# Patient Record
Sex: Male | Born: 1995 | Race: Black or African American | Hispanic: No | Marital: Single | Smoking: Former smoker
Health system: Southern US, Community
[De-identification: ages and names within clinical notes are randomized; demographics above are authoritative.]

## PROBLEM LIST (undated history)

## (undated) DIAGNOSIS — M549 Dorsalgia, unspecified: Secondary | ICD-10-CM

---
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
## 2018-01-21 ENCOUNTER — Emergency Department: Payer: Self-pay

## 2018-01-21 ENCOUNTER — Other Ambulatory Visit: Payer: Self-pay

## 2018-01-21 ENCOUNTER — Emergency Department
Admission: EM | Admit: 2018-01-21 | Discharge: 2018-01-21 | Disposition: A | Discharge status: Home / Self Care | Payer: Self-pay | Attending: Emergency Medicine | Admitting: Emergency Medicine

## 2018-01-21 DIAGNOSIS — G8929 Other chronic pain: Secondary | ICD-10-CM | POA: Insufficient documentation

## 2018-01-21 DIAGNOSIS — M545 Low back pain: Secondary | ICD-10-CM | POA: Insufficient documentation

## 2018-01-21 MED ORDER — BACLOFEN 10 MG PO TABS: 10.0000 mg | ORAL_TABLET | Freq: Every day | ORAL | 1 refills | Status: DC

## 2018-01-21 MED ORDER — MELOXICAM 15 MG PO TABS: 15.0000 mg | ORAL_TABLET | Freq: Every day | ORAL | 2 refills | Status: DC

## 2018-01-21 NOTE — ED Provider Notes (Addendum)
[facility] Emergency Department Provider Note  ____________________________________________   First MD Initiated Contact with Patient 01/21/18 1854     (approximate)  I have reviewed the triage vital signs and the nursing notes.   HISTORY  Chief Complaint Back Pain and Chest Pain    HPI Larry Soto is a 22 y.o. male presents emergency department complaining of intermittent low back pain, upper back pain shoulder pain and chest pain.  States all the pain is worse with movement.  States he has had this ongoing pain which is worse at night and in the mornings.  He states that this all with movement.  He denies any shortness of breath.  He denies fever or cough.  History reviewed. No pertinent past medical history.  There are no active problems to display for this patient.   History reviewed. No pertinent surgical history.  Prior to Admission medications   Medication Sig Start Date End Date Taking? Authorizing Provider  baclofen (LIORESAL) 10 MG tablet Take 1 tablet (10 mg total) by mouth daily. 01/21/18 01/21/19  Yuliana Vandrunen, Roselyn Bering, PA-C  meloxicam (MOBIC) 15 MG tablet Take 1 tablet (15 mg total) by mouth daily. 01/21/18 01/21/19  Faythe Ghee, PA-C    Allergies Patient has no known allergies.  No family history on file.  Social History Social History   Tobacco Use  . Smoking status: Never Smoker  . Smokeless tobacco: Never Used  Substance Use Topics  . Alcohol use: No  . Drug use: No    Review of Systems  Constitutional: No fever/chills Eyes: No visual changes. ENT: No sore throat. Respiratory: Denies cough Genitourinary: Negative for dysuria. Musculoskeletal: Positive for back pain, shoulder pain, and chest pain. Skin: Negative for rash.    ____________________________________________   PHYSICAL EXAM:  VITAL SIGNS: ED Triage Vitals  Enc Vitals Group     BP 01/21/18 1752 (!) 154/86     Pulse Rate 01/21/18 1752 100     Resp  01/21/18 1752 20     Temp 01/21/18 1752 98.6 F (37 C)     Temp Source 01/21/18 1752 Oral     SpO2 01/21/18 1752 99 %     Weight 01/21/18 1753 144 lb (65.3 kg)     Height 01/21/18 1753 6' 2.5" (1.892 m)     Head Circumference --      Peak Flow --      Pain Score 01/21/18 1754 8     Pain Loc --      Pain Edu? --      Excl. in GC? --     Constitutional: Alert and oriented. Well appearing and in no acute distress. Eyes: Conjunctivae are normal.  Head: Atraumatic. Nose: No congestion/rhinnorhea. Mouth/Throat: Mucous membranes are moist.   Neck: Is supple, no lymphadenopathy is noted Cardiovascular: Normal rate, regular rhythm.  Heart sounds are normal Respiratory: Normal respiratory effort.  No retractions, lungs are clear to auscultation GU: deferred Musculoskeletal: FROM all extremities, warm and well perfused, lumbar spine is tender to palpation.  Shoulders are tender in the trapezius, the pec muscles are tender in the sternum is mildly tender. Neurologic:  Normal speech and language.  Skin:  Skin is warm, dry and intact. No rash noted. Psychiatric: Mood and affect are normal. Speech and behavior are normal.  ____________________________________________   LABS (all labs ordered are listed, but only abnormal results are displayed)  Labs Reviewed - No data to display ____________________________________________   ____________________________________________  RADIOLOGY  Lumbar spine is negative for any acute abnormality  ____________________________________________   PROCEDURES  Procedure(s) performed: EKG ordered in triage shows normal sinus rhythm, this was read by Dr. Marisa SeverinSiadecki  Procedures    ____________________________________________   INITIAL IMPRESSION / ASSESSMENT AND PLAN / ED COURSE  Pertinent labs & imaging results that were available during my care of the patient were reviewed by me and considered in my medical decision making (see chart for  details).  Patient is 22 year old male presents emergency department complaining of intermittent back pain, chest pain and shoulder pain for about 7 months.  He denies any injury  Physical exam he appears very well.  He is thin.  The lumbar spine is mildly tender to palpation.  The remainder of the tenderness in the shoulders and chest is mainly in the muscles.  X-ray of the lumbar spine was ordered   X-ray is negative for any acute abnormality.  X-ray results were given to the patient.  He was given a prescription for meloxicam and baclofen.  He is to follow-up with her regular doctor for full physical.  Explained to him that we do not do standard blood tests such as vitamin D which he may need.  Family members are questioning why we did not do blood work.  Explained to him that pain for 7 months is considered chronic and that it would be advisable for him to see his family physician for a full physical with lab work.  They state they understand.  The patient was discharged in stable condition  As part of my medical decision making, I reviewed the following data within the electronic MEDICAL RECORD NUMBER Nursing notes reviewed and incorporated, Radiograph reviewed x-ray of the lumbar spine is negative, EKG was read by Dr. Marisa SeverinSiadecki, shows normal sinus rhythm Notes from prior ED visits and [place] Controlled Substance Database  ____________________________________________   FINAL CLINICAL IMPRESSION(S) / ED DIAGNOSES  Final diagnoses:  Chronic bilateral low back pain without sciatica      NEW MEDICATIONS STARTED DURING THIS VISIT:  Discharge Medication List as of 01/21/2018  7:56 PM    START taking these medications   Details  baclofen (LIORESAL) 10 MG tablet Take 1 tablet (10 mg total) by mouth daily., Starting Tue 01/21/2018, Until Wed 01/21/2019, Print    meloxicam (MOBIC) 15 MG tablet Take 1 tablet (15 mg total) by mouth daily., Starting Tue 01/21/2018, Until Wed 01/21/2019, Print          Note:  This document was prepared using Dragon voice recognition software and may include unintentional dictation errors.    Faythe GheeFisher, Kyriaki Moder W, PA-C 01/21/18 2251    Faythe GheeFisher, Solimar Maiden W, PA-C 01/22/18 0000    Dionne BucySiadecki, Sebastian, MD 01/22/18 [phone]

## 2018-01-21 NOTE — Discharge Instructions (Signed)
Follow-up with your regular doctor if not better in 5-7 days.  Drink plenty of fluids.  Take medication as needed.  Performed stretches daily to offset your back pain.  Good posture also helps.

## 2018-01-21 NOTE — ED Triage Notes (Signed)
Presents with intermittent back pain chest pain and shoulder for about 7 month  Denies any fever,or cough or fever

## 2018-04-14 ENCOUNTER — Emergency Department
Admission: EM | Admit: 2018-04-14 | Discharge: 2018-04-14 | Disposition: A | Discharge status: Home / Self Care | Payer: Self-pay | Attending: Dermatology | Admitting: Dermatology

## 2018-04-14 ENCOUNTER — Other Ambulatory Visit: Payer: Self-pay

## 2018-04-14 DIAGNOSIS — M545 Low back pain, unspecified: Secondary | ICD-10-CM

## 2018-04-14 DIAGNOSIS — R51 Headache: Secondary | ICD-10-CM | POA: Insufficient documentation

## 2018-04-14 DIAGNOSIS — R519 Headache, unspecified: Secondary | ICD-10-CM

## 2018-04-14 LAB — URINALYSIS, COMPLETE (UACMP) WITH MICROSCOPIC
Bacteria, UA: NONE SEEN
Bilirubin Urine: NEGATIVE
Glucose, UA: NEGATIVE mg/dL
Hgb urine dipstick: NEGATIVE
Ketones, ur: NEGATIVE mg/dL
Nitrite: NEGATIVE
Protein, ur: NEGATIVE mg/dL
SQUAMOUS EPITHELIAL / LPF: NONE SEEN (ref 0–5)
Specific Gravity, Urine: 1.023 (ref 1.005–1.030)
pH: 5 (ref 5.0–8.0)

## 2018-04-14 MED ORDER — KETOROLAC TROMETHAMINE 30 MG/ML IJ SOLN
30.0000 mg | Freq: Once | INTRAMUSCULAR | Status: AC
  Administered 2018-04-14: 30 mg via INTRAMUSCULAR
  Filled 2018-04-14: qty 1

## 2018-04-14 MED ORDER — NAPROXEN 500 MG PO TABS: 500.0000 mg | ORAL_TABLET | Freq: Two times a day (BID) | ORAL | 0 refills | Status: DC

## 2018-04-14 NOTE — ED Provider Notes (Signed)
[facility] Emergency Department Provider Note  ___________________________________________   First MD Initiated Contact with Patient 04/14/18 1411     (approximate)  I have reviewed the triage vital signs and the nursing notes.   HISTORY  Chief Complaint Back Pain and Headache  HPI Larry Soto is a 22 y.o. male is here with complaint of mid to lower back pain for 1 month.  Patient denies any injury.  He states he is occasionally taken Tylenol that his grandmother had but no other medications.  He denies any urinary symptoms or history of kidney stones.  He also complains of a headache and bilateral ear pain that also has been occurring for 1 month.  He denies any fever, chills, nausea or vomiting.  He denies any difficulty with his vision, dizziness.  He today rates his pain a 10/10.  History reviewed. No pertinent past medical history.  There are no active problems to display for this patient.   History reviewed. No pertinent surgical history.  Prior to Admission medications   Medication Sig Start Date End Date Taking? Authorizing Provider  naproxen (NAPROSYN) 500 MG tablet Take 1 tablet (500 mg total) by mouth 2 (two) times daily with a meal. 04/14/18   Tommi Rumps, PA-C    Allergies Patient has no known allergies.  No family history on file.  Social History Social History   Tobacco Use  . Smoking status: Never Smoker  . Smokeless tobacco: Never Used  Substance Use Topics  . Alcohol use: No  . Drug use: No    Review of Systems Constitutional: No fever/chills Eyes: No visual changes. ENT: No sore throat.  Positive bilateral ear pain. Cardiovascular: Denies chest pain. Respiratory: Denies shortness of breath. Gastrointestinal: No abdominal pain.  No nausea, no vomiting.  Genitourinary: Negative for dysuria.  Negative for hematuria. Musculoskeletal: Positive for back pain. Skin: Negative for rash. Neurological: Positive for  headaches, focal weakness or numbness. ___________________________________________   PHYSICAL EXAM:  VITAL SIGNS: ED Triage Vitals [04/14/18 1341]  Enc Vitals Group     BP 132/83     Pulse Rate 82     Resp 14     Temp 98.3 F (36.8 C)     Temp Source Oral     SpO2 100 %     Weight 145 lb (65.8 kg)     Height 6\' 3"  (1.905 m)     Head Circumference      Peak Flow      Pain Score 10     Pain Loc      Pain Edu?      Excl. in GC?    Constitutional: Alert and oriented. Well appearing and in no acute distress. Eyes: Conjunctivae are normal. PERRL. EOMI. Head: Atraumatic. Nose: No congestion/rhinnorhea.  Season TMs are clear bilaterally. Mouth/Throat: Mucous membranes are moist.  Oropharynx non-erythematous. Neck: No stridor.  Nontender to palpation posteriorly.  Range of motion is without restriction or pain. Hematological/Lymphatic/Immunilogical: No cervical lymphadenopathy. Cardiovascular: Normal rate, regular rhythm. Grossly normal heart sounds.  Good peripheral circulation. Respiratory: Normal respiratory effort.  No retractions. Lungs CTAB. Gastrointestinal: Soft and nontender. No distention.  No CVA tenderness. Musculoskeletal: Examination of the lower back there is no gross deformity and no point tenderness on palpation.  Range of motion is without restriction.  No active muscle spasms were seen.  Normal gait was noted.  Good muscle strength bilaterally. Neurologic:  Normal speech and language. No gross focal neurologic deficits are appreciated.  Cranial nerves II through XII grossly intact.  No gait instability. Skin:  Skin is warm, dry and intact. No rash noted. Psychiatric: Mood and affect are normal. Speech and behavior are normal.  ____________________________________________   LABS (all labs ordered are listed, but only abnormal results are displayed)  Labs Reviewed  URINALYSIS, COMPLETE (UACMP) WITH MICROSCOPIC - Abnormal; Notable for the following components:       Result Value   Color, Urine YELLOW (*)    APPearance CLEAR (*)    Leukocytes, UA TRACE (*)    All other components within normal limits    PROCEDURES  Procedure(s) performed: None  Procedures  Critical Care performed: No  ____________________________________________   INITIAL IMPRESSION / ASSESSMENT AND PLAN / ED COURSE  As part of my medical decision making, I reviewed the following data within the electronic MEDICAL RECORD NUMBER Notes from prior ED visits and [place] Controlled Substance Database  Patient was given Toradol 30 mg IM while in the ED.  He was reassured that urinalysis was negative for any infection.  He is encouraged to take naproxen 500 mg twice daily every day for the next 7 days.  He also is encouraged to [facility] in the past to make an appointment for any continued medication and if not improving.  ____________________________________________   FINAL CLINICAL IMPRESSION(S) / ED DIAGNOSES  Final diagnoses:  Acute bilateral low back pain without sciatica  Nonintractable headache, unspecified chronicity pattern, unspecified headache type     ED Discharge Orders        Ordered    naproxen (NAPROSYN) 500 MG tablet  2 times daily with meals     04/14/18 1555       Note:  This document was prepared using Dragon voice recognition software and may include unintentional dictation errors.    Tommi RumpsSummers,  L, PA-C 04/14/18 1559    Jeanmarie PlantMcShane, James A, MD 04/25/18 [phone]

## 2018-04-14 NOTE — ED Triage Notes (Signed)
Pt c/o lower/mid back pain - c/o bilat eye pain with twitching - bilat ear pain - headaches

## 2018-04-14 NOTE — ED Notes (Signed)
See triage note  Presents with lower back pain which has been intermittent for about 6 months  Denies any injury  Ambulates well

## 2018-04-14 NOTE — Discharge Instructions (Addendum)
Call [facility] and make an appointment.  Begin taking naproxen 500 mg twice daily with food.  You may also use ice or heat to your back as needed.  Urinalysis today was negative for infection.

## 2018-06-04 ENCOUNTER — Emergency Department
Admission: EM | Admit: 2018-06-04 | Discharge: 2018-06-04 | Disposition: A | Discharge status: Home / Self Care | Payer: Self-pay | Attending: Emergency Medicine | Admitting: Emergency Medicine

## 2018-06-04 ENCOUNTER — Other Ambulatory Visit: Payer: Self-pay

## 2018-06-04 ENCOUNTER — Emergency Department: Payer: Self-pay

## 2018-06-04 DIAGNOSIS — M545 Low back pain: Secondary | ICD-10-CM | POA: Insufficient documentation

## 2018-06-04 DIAGNOSIS — R0789 Other chest pain: Secondary | ICD-10-CM | POA: Insufficient documentation

## 2018-06-04 DIAGNOSIS — F17200 Nicotine dependence, unspecified, uncomplicated: Secondary | ICD-10-CM | POA: Insufficient documentation

## 2018-06-04 DIAGNOSIS — G8929 Other chronic pain: Secondary | ICD-10-CM | POA: Insufficient documentation

## 2018-06-04 LAB — BASIC METABOLIC PANEL
Anion gap: 7 (ref 5–15)
BUN: 18 mg/dL (ref 6–20)
CO2: 28 mmol/L (ref 22–32)
Calcium: 9.9 mg/dL (ref 8.9–10.3)
Chloride: 106 mmol/L (ref 98–111)
Creatinine, Ser: 1.14 mg/dL (ref 0.61–1.24)
GFR calc non Af Amer: >60 mL/min (ref >60)
Glucose, Bld: 104 mg/dL — ABNORMAL HIGH (ref 70–99)
POTASSIUM: 3.7 mmol/L (ref 3.5–5.1)
SODIUM: 141 mmol/L (ref 135–145)

## 2018-06-04 LAB — CBC
HEMATOCRIT: 42.7 % (ref 40.0–52.0)
Hemoglobin: 14.2 g/dL (ref 13.0–18.0)
MCH: 27.8 pg (ref 26.0–34.0)
MCHC: 33.4 g/dL (ref 32.0–36.0)
MCV: 83.4 fL (ref 80.0–100.0)
Platelets: 171 10*3/uL (ref 150–440)
RBC: 5.12 MIL/uL (ref 4.40–5.90)
RDW: 14.2 % (ref 11.5–14.5)
WBC: 3.5 10*3/uL — ABNORMAL LOW (ref 3.8–10.6)

## 2018-06-04 LAB — TROPONIN I: Troponin I: <0.03 ng/mL (ref <0.03)

## 2018-06-04 MED ORDER — NAPROXEN 500 MG PO TABS: 500.0000 mg | ORAL_TABLET | Freq: Two times a day (BID) | ORAL | 0 refills | Status: DC

## 2018-06-04 MED ORDER — IOPAMIDOL (ISOVUE-370) INJECTION 76%
75.0000 mL | Freq: Once | INTRAVENOUS | Status: AC | PRN
  Administered 2018-06-04: 100 mL via INTRAVENOUS
  Filled 2018-06-04: qty 75

## 2018-06-04 NOTE — ED Provider Notes (Signed)
[facility] Emergency Department Provider Note  ____________________________________________   I have reviewed the triage vital signs and the nursing notes. Where available I have reviewed prior notes and, if possible and indicated, outside hospital notes.    HISTORY  Chief Complaint Chest Pain and Back Pain    HPI Larry Soto is a 22 y.o. male with no significant medical problems has had chest pain off and on for the last 3 years, over the last 4 months is been constant.  He also has a chronic recurrent back pain, is in the lower back.  He denies any numbness or weakness, the pain is not pleuritic, seems to be better in the morning and then worse as the day goes on.  He states he has had no radiation of the pain, is a discomfort in the right chest wall.  No trauma recollected.  Patient has had similar pains going back to 2016.  He denies any other symptoms such as numbness or weakness.  He cannot further characterize the discomfort.  He states it is always with him but sometimes it gets worse and is been worse over the last month or so.  He has been to the emergency room for it several times.  Naproxen seem to help for a while.   History reviewed. No pertinent past medical history.  There are no active problems to display for this patient.   History reviewed. No pertinent surgical history.  Prior to Admission medications   Medication Sig Start Date End Date Taking? Authorizing Provider  naproxen (NAPROSYN) 500 MG tablet Take 1 tablet (500 mg total) by mouth 2 (two) times daily with a meal. 04/14/18   Tommi Rumps, PA-C    Allergies Patient has no known allergies.  History reviewed. No pertinent family history.  Social History Social History   Tobacco Use  . Smoking status: Current Some Day Smoker  . Smokeless tobacco: Never Used  Substance Use Topics  . Alcohol use: Yes  . Drug use: No    Review of Systems Constitutional: No  fever/chills Eyes: No visual changes. ENT: No sore throat. No stiff neck no neck pain Cardiovascular: Denies chest pain. Respiratory: Denies shortness of breath. Gastrointestinal:   no vomiting.  No diarrhea.  No constipation. Genitourinary: Negative for dysuria. Musculoskeletal: Negative lower extremity swelling Skin: Negative for rash. Neurological: Negative for severe headaches, focal weakness or numbness.   ____________________________________________   PHYSICAL EXAM:  VITAL SIGNS: ED Triage Vitals  Enc Vitals Group     BP 06/04/18 1516 120/87     Pulse Rate 06/04/18 1516 (!) 105     Resp 06/04/18 1516 18     Temp 06/04/18 1516 98.6 F (37 C)     Temp Source 06/04/18 1516 Oral     SpO2 06/04/18 1516 97 %     Weight 06/04/18 1517 145 lb (65.8 kg)     Height 06/04/18 1517 6\' 3"  (1.905 m)     Head Circumference --      Peak Flow --      Pain Score 06/04/18 1517 9     Pain Loc --      Pain Edu? --      Excl. in GC? --     Constitutional: Alert and oriented. Well appearing and in no acute distress.  he is very tall and slender Eyes: Conjunctivae are normal Head: Atraumatic HEENT: No congestion/rhinnorhea. Mucous membranes are moist.  Oropharynx non-erythematous Neck:   Nontender with no meningismus,  no masses, no stridor Cardiovascular: Normal rate, regular rhythm. Grossly normal heart sounds.  Good peripheral circulation. Respiratory: Normal respiratory effort.  No retractions. Lungs CTAB. Chest: Tender to palpation of chest wall when I touch this area patient states ouch that the discomfort right there and pulls back. Abdominal: Soft and nontender. No distention. No guarding no rebound Back:  There is no focal tenderness or step off.  there is no midline tenderness there are no lesions noted. there is no CVA tenderness Musculoskeletal: No lower extremity tenderness, no upper extremity tenderness. No joint effusions, no DVT signs strong distal pulses no edema, patient  does meet some criteria in the wrist and thumb for possible Marfan's. Neurologic:  Normal speech and language. No gross focal neurologic deficits are appreciated.  Skin:  Skin is warm, dry and intact. No rash noted. Psychiatric: Mood and affect are normal. Speech and behavior are normal.  ____________________________________________   LABS (all labs ordered are listed, but only abnormal results are displayed)  Labs Reviewed  BASIC METABOLIC PANEL - Abnormal; Notable for the following components:      Result Value   Glucose, Bld 104 (*)    All other components within normal limits  CBC - Abnormal; Notable for the following components:   WBC 3.5 (*)    All other components within normal limits  TROPONIN I    Pertinent labs  results that were available during my care of the patient were reviewed by me and considered in my medical decision making (see chart for details). ____________________________________________  EKG  I personally interpreted any EKGs ordered by me or triage  Sinus rhythm rate 94 bpm, borderline RAD, no acute ST elevation or depression, with diffuse T wave and normality is noted which are consistent with prior.  No change from prior EKG of any significance noted ____________________________________________  RADIOLOGY  Pertinent labs & imaging results that were available during my care of the patient were reviewed by me and considered in my medical decision making (see chart for details). If possible, patient and/or family made aware of any abnormal findings.  Dg Chest 2 View  Result Date: 06/04/2018 CLINICAL DATA:  Central chest pain extending back months. EXAM: CHEST - 2 VIEW COMPARISON:  None. FINDINGS: The heart size and mediastinal contours are within normal limits. Both lungs are clear. The visualized skeletal structures are unremarkable. IMPRESSION: No active cardiopulmonary disease. Electronically Signed   By: Marin Robertshristopher  Mattern M.D.   On: 06/04/2018  15:39   ____________________________________________    PROCEDURES  Procedure(s) performed: None  Procedures  Critical Care performed: None  ____________________________________________   INITIAL IMPRESSION / ASSESSMENT AND PLAN / ED COURSE  Pertinent labs & imaging results that were available during my care of the patient were reviewed by me and considered in my medical decision making (see chart for details).  Patient is here with reproducible and very atypical chest pain as well as low back pain, this is mostly consistent with muscular skeletal pathology however, patient is 6 foot 3, his mother is 5 feet 6 and his father is 5 foot 10 and he has a marfanoid appearance to me, clearly this is not the most sensitive test but concern might exist for dissection and/or other vascular pathology related to his possible underlying condition.  Given this, the fact that he had repeated visits for pain in his chest, we will obtain CT scan to rule out dissection.  Low suspicion for PE, he does not have any risk factors for  PE there is no personal family history of PE no leg swelling no recent travel no pleuritic pain no dyspnea, and if he were possibly more panic I would not do CT at all however we will do that and if that is negative, with negative troponin I think will be able to safely get him home with close outpatient follow-up with cardiology.  There is no history of early cardiac death in his family we will advise him to avoid athletics until he is cleared by cards.    ____________________________________________   FINAL CLINICAL IMPRESSION(S) / ED DIAGNOSES  Final diagnoses:  None      This chart was dictated using voice recognition software.  Despite best efforts to proofread,  errors can occur which can change meaning.      Jeanmarie Plant, MD 06/04/18 [phone]

## 2018-06-04 NOTE — Discharge Instructions (Signed)
Follow closely with cardiology for further evaluation of your chest pain in the next few days.  Return to the emergency room for any new or worrisome symptoms, and avoid exercise or strenuous activity until cleared by cardiology.

## 2018-06-04 NOTE — ED Notes (Signed)
Patient transported to CT 

## 2018-06-04 NOTE — ED Notes (Signed)
ED Provider at bedside. 

## 2018-06-04 NOTE — ED Triage Notes (Signed)
Pt arrives to ED c/o central CP and back pain x 4 months. Alert, oriented, ambulatory. No distress noted.

## 2018-06-04 NOTE — ED Notes (Signed)
Pt alert and oriented X4, active, cooperative, pt in NAD. RR even and unlabored, color WNL.  Discharge and followup instructions reviewed. Pt informed to return if any life threatening symptoms occur.   

## 2019-01-15 ENCOUNTER — Other Ambulatory Visit: Payer: Self-pay

## 2019-01-15 ENCOUNTER — Emergency Department: Payer: Self-pay

## 2019-01-15 ENCOUNTER — Encounter: Payer: Self-pay | Admitting: *Deleted

## 2019-01-15 ENCOUNTER — Emergency Department
Admission: EM | Admit: 2019-01-15 | Discharge: 2019-01-15 | Disposition: A | Discharge status: Home / Self Care | Payer: Self-pay | Attending: Emergency Medicine | Admitting: Emergency Medicine

## 2019-01-15 DIAGNOSIS — F17200 Nicotine dependence, unspecified, uncomplicated: Secondary | ICD-10-CM | POA: Insufficient documentation

## 2019-01-15 DIAGNOSIS — R059 Cough, unspecified: Secondary | ICD-10-CM

## 2019-01-15 DIAGNOSIS — R05 Cough: Secondary | ICD-10-CM | POA: Insufficient documentation

## 2019-01-15 DIAGNOSIS — J069 Acute upper respiratory infection, unspecified: Secondary | ICD-10-CM | POA: Insufficient documentation

## 2019-01-15 MED ORDER — ALBUTEROL SULFATE HFA 108 (90 BASE) MCG/ACT IN AERS: 2.0000 | INHALATION_SPRAY | Freq: Four times a day (QID) | RESPIRATORY_TRACT | 0 refills | Status: DC | PRN

## 2019-01-15 MED ORDER — CYCLOBENZAPRINE HCL 5 MG PO TABS: 5.0000 mg | ORAL_TABLET | Freq: Three times a day (TID) | ORAL | 0 refills | Status: DC | PRN

## 2019-01-15 MED ORDER — BENZONATATE 100 MG PO CAPS: ORAL_CAPSULE | ORAL | 0 refills | Status: DC

## 2019-01-15 MED ORDER — NABUMETONE 750 MG PO TABS: 750.0000 mg | ORAL_TABLET | Freq: Two times a day (BID) | ORAL | 0 refills | Status: DC

## 2019-01-15 NOTE — ED Provider Notes (Signed)
[facility] Emergency Department Provider Note ____________________________________________  Time seen: 1334  I have reviewed the triage vital signs and the nursing notes.  HISTORY  Chief Complaint  Influenza  HPI Larry Soto is a 23 y.o. male presents himself to the ED for a 2-week complaint of nonproductive cough, mild body aches, and congestion.  Patient also describes some runny nose, but denies any outright fevers.  He also denies any sick contacts, recent travel, or other exposures.  Patient has been taking Tylenol intermittently for symptom relief.  History reviewed. No pertinent past medical history.  There are no active problems to display for this patient.  History reviewed. No pertinent surgical history.  Prior to Admission medications   Medication Sig Start Date End Date Taking? Authorizing Provider  albuterol (PROVENTIL HFA;VENTOLIN HFA) 108 (90 Base) MCG/ACT inhaler Inhale 2 puffs into the lungs every 6 (six) hours as needed for wheezing or shortness of breath. 01/15/19   Ruhaan Nordahl, Charlesetta Ivory, PA-C  benzonatate (TESSALON PERLES) 100 MG capsule Take 1-2 tabs TID prn cough 01/15/19   Quashawn Jewkes, Charlesetta Ivory, PA-C  cyclobenzaprine (FLEXERIL) 5 MG tablet Take 1 tablet (5 mg total) by mouth 3 (three) times daily as needed for muscle spasms. 01/15/19   Nevyn Bossman, Charlesetta Ivory, PA-C  nabumetone (RELAFEN) 750 MG tablet Take 1 tablet (750 mg total) by mouth 2 (two) times daily. 01/15/19   Antonino Nienhuis, Charlesetta Ivory, PA-C    Allergies Patient has no known allergies.  No family history on file.  Social History Social History   Tobacco Use  . Smoking status: Current Some Day Smoker  . Smokeless tobacco: Never Used  Substance Use Topics  . Alcohol use: Yes  . Drug use: No    Review of Systems  Constitutional: Negative for fever. Eyes: Negative for visual changes. ENT: Negative for sore throat. Cardiovascular: Negative for chest pain. Respiratory:  Negative for shortness of breath.  Reports mild intermittent cough. Gastrointestinal: Negative for abdominal pain, vomiting and diarrhea. Genitourinary: Negative for dysuria. Musculoskeletal: Negative for back pain. Skin: Negative for rash. Neurological: Negative for headaches, focal weakness or numbness. ____________________________________________  PHYSICAL EXAM:  VITAL SIGNS: ED Triage Vitals [01/15/19 1255]  Enc Vitals Group     BP 137/71     Pulse Rate 65     Resp 16     Temp 98.5 F (36.9 C)     Temp Source Oral     SpO2 100 %     Weight 145 lb (65.8 kg)     Height      Head Circumference      Peak Flow      Pain Score 9     Pain Loc      Pain Edu?      Excl. in GC?     Constitutional: Alert and oriented. Well appearing and in no distress. Head: Normocephalic and atraumatic. Eyes: Conjunctivae are normal. Normal extraocular movements Ears: Canals clear. TMs intact bilaterally. Nose: No congestion/rhinorrhea/epistaxis. Mouth/Throat: Mucous membranes are moist.  Uvula is midline and tonsils are flat. Neck: Supple. No thyromegaly. Hematological/Lymphatic/Immunological: No cervical lymphadenopathy. Cardiovascular: Normal rate, regular rhythm. Normal distal pulses. Respiratory: Normal respiratory effort. No wheezes/rales/rhonchi. Gastrointestinal: Soft and nontender. No distention. Musculoskeletal: Nontender with normal range of motion in all extremities.  Neurologic:  Normal gait without ataxia. Normal speech and language. No gross focal neurologic deficits are appreciated. Skin:  Skin is warm, dry and intact. No rash noted. ___________________________________________   RADIOLOGY  CXR negative ____________________________________________  PROCEDURES  Procedures ____________________________________________  INITIAL IMPRESSION / ASSESSMENT AND PLAN / ED COURSE  Patient with ED evaluation of a 2-week complaint of intermittent cough without production.   Patient's been afebrile in the interim reporting some mild sinus drainage.  His x-ray is negative at this time for any acute cardiopulmonary process.  Patient's exam is also reassuring as it shows no acute respiratory distress or dehydration.  Patient will be did her 3 prescriptions for albuterol, Tessalon Perles, Flonase, cyclobenzaprine, and Relafen.  He is encouraged to follow-up with a local community clinic for ongoing symptoms.  Return precautions have been reviewed.  Work is provided for 1 day as requested. ____________________________________________  FINAL CLINICAL IMPRESSION(S) / ED DIAGNOSES  Final diagnoses:  Cough  Viral upper respiratory tract infection      Lissa Hoard, PA-C 01/15/19 1436    Phineas Semen, MD 01/15/19 1441

## 2019-01-15 NOTE — ED Notes (Signed)
See triage note  Presents with a 2 week hx of flu sx's   No fever   States he is still having runny nose and cough  Also having back pain  States pain radiates from neck into entire back and into legs  Ambulates well and denies any injury

## 2019-01-15 NOTE — ED Triage Notes (Signed)
Pt is here for body aches.  He describes "flu like symptoms" for 2 weeks.  Pt has been having a cough, runny nose and body aches, no fever. No travel out of the country

## 2019-01-15 NOTE — Discharge Instructions (Signed)
Your exam and x-ray are essentially normal at this time. Take OTC Delsym for additional cough relief. Start a daily allergy medicine for runny nose. Take the prescription meds as directed. Follow-up with [facility] for ongoing symptoms.

## 2019-03-19 ENCOUNTER — Emergency Department: Payer: Self-pay

## 2019-03-19 ENCOUNTER — Emergency Department
Admission: EM | Admit: 2019-03-19 | Discharge: 2019-03-19 | Disposition: A | Discharge status: Home / Self Care | Payer: Self-pay | Attending: Emergency Medicine | Admitting: Emergency Medicine

## 2019-03-19 ENCOUNTER — Other Ambulatory Visit: Payer: Self-pay

## 2019-03-19 DIAGNOSIS — R0789 Other chest pain: Secondary | ICD-10-CM

## 2019-03-19 DIAGNOSIS — R079 Chest pain, unspecified: Secondary | ICD-10-CM | POA: Insufficient documentation

## 2019-03-19 DIAGNOSIS — F17210 Nicotine dependence, cigarettes, uncomplicated: Secondary | ICD-10-CM | POA: Insufficient documentation

## 2019-03-19 LAB — CBC
HCT: 45.7 % (ref 39.0–52.0)
Hemoglobin: 14.6 g/dL (ref 13.0–17.0)
MCH: 27 pg (ref 26.0–34.0)
MCHC: 31.9 g/dL (ref 30.0–36.0)
MCV: 84.5 fL (ref 80.0–100.0)
Platelets: 199 10*3/uL (ref 150–400)
RBC: 5.41 MIL/uL (ref 4.22–5.81)
RDW: 13.9 % (ref 11.5–15.5)
WBC: 3.5 10*3/uL — ABNORMAL LOW (ref 4.0–10.5)
nRBC: 0 % (ref 0.0–0.2)

## 2019-03-19 LAB — BASIC METABOLIC PANEL
Anion gap: 6 (ref 5–15)
BUN: 13 mg/dL (ref 6–20)
CO2: 29 mmol/L (ref 22–32)
Calcium: 9.6 mg/dL (ref 8.9–10.3)
Chloride: 104 mmol/L (ref 98–111)
Creatinine, Ser: 1.14 mg/dL (ref 0.61–1.24)
GFR calc Af Amer: >60 mL/min (ref >60)
GFR calc non Af Amer: >60 mL/min (ref >60)
Glucose, Bld: 104 mg/dL — ABNORMAL HIGH (ref 70–99)
Potassium: 4.2 mmol/L (ref 3.5–5.1)
Sodium: 139 mmol/L (ref 135–145)

## 2019-03-19 LAB — TROPONIN I: Troponin I: <0.03 ng/mL (ref <0.03)

## 2019-03-19 MED ORDER — SODIUM CHLORIDE 0.9% FLUSH: 3.0000 mL | Freq: Once | INTRAVENOUS | Status: DC

## 2019-03-19 NOTE — Discharge Instructions (Addendum)
Your exam, labs, and CXR are negative and reassuring at this time. Take the prescription meds you have at home, as directed. Follow-up with cardiology for ongoing symptoms.

## 2019-03-19 NOTE — ED Triage Notes (Signed)
Pt c/o back and chest pain for the past year, for the past 4 days having left arm pain. Pt is in NAD at present.

## 2019-03-19 NOTE — ED Notes (Signed)
Pt c/o constant central CP 8/10 radiates to left arm. Per pt "chest pain goes to my left arm when I move my arm it hurst and I can't even sleep" Pt denies SHOB, dizzines, N/D.

## 2019-03-19 NOTE — ED Notes (Addendum)
Pt st taking "2 aleve" for pain at home w/o relief of symptoms.

## 2019-03-19 NOTE — ED Provider Notes (Signed)
[facility] Emergency Department Provider Note ____________________________________________  Time seen: 1429  I have reviewed the triage vital signs and the nursing notes.  HISTORY  Chief Complaint  Chest Pain  HPI Larry Soto is a 23 y.o. male presents himself to the ED with complaints of chest pain and back pain.  Patient describes onset is been persistent intermittent for the past 4 days, although his report and chart reveals he has had symptoms like this for the past 4 to 5 years.  He describes he has had some discomfort into the anterior left arm and into the forearm, which prompted him to present today for further evaluation.  Patient reports some concern over heart attack, given the fact that his grandmother had a heart attack in the past, which she survived.  He denies any witness of breath, nausea, vomiting, diaphoresis, or syncope.  He also denies any sick contacts, recent travel, or other high risk exposures.  History reviewed. No pertinent past medical history.  There are no active problems to display for this patient.  History reviewed. No pertinent surgical history.  Prior to Admission medications   Medication Sig Start Date End Date Taking? Authorizing Provider  albuterol (PROVENTIL HFA;VENTOLIN HFA) 108 (90 Base) MCG/ACT inhaler Inhale 2 puffs into the lungs every 6 (six) hours as needed for wheezing or shortness of breath. 01/15/19   Adebayo Ensminger, Charlesetta IvoryJenise V Bacon, PA-C  benzonatate (TESSALON PERLES) 100 MG capsule Take 1-2 tabs TID prn cough Patient not taking: Reported on 03/19/2019 01/15/19   Rue Tinnel, Charlesetta IvoryJenise V Bacon, PA-C  cyclobenzaprine (FLEXERIL) 5 MG tablet Take 1 tablet (5 mg total) by mouth 3 (three) times daily as needed for muscle spasms. 01/15/19   Graysin Luczynski, Charlesetta IvoryJenise V Bacon, PA-C  nabumetone (RELAFEN) 750 MG tablet Take 1 tablet (750 mg total) by mouth 2 (two) times daily. 01/15/19   Lejuan Botto, Charlesetta IvoryJenise V Bacon, PA-C    Allergies Patient has no known  allergies.  No family history on file.  Social History Social History   Tobacco Use  . Smoking status: Current Some Day Smoker  . Smokeless tobacco: Never Used  Substance Use Topics  . Alcohol use: Yes  . Drug use: No    Review of Systems  Constitutional: Negative for fever. Eyes: Negative for visual changes. ENT: Negative for sore throat. Cardiovascular: Positive for chest pain. Respiratory: Negative for shortness of breath. Gastrointestinal: Negative for abdominal pain, vomiting and diarrhea. Genitourinary: Negative for dysuria. Musculoskeletal: Negative for back pain. Skin: Negative for rash. Neurological: Negative for headaches, focal weakness or numbness. ____________________________________________  PHYSICAL EXAM:  VITAL SIGNS: ED Triage Vitals  Enc Vitals Group     BP 03/19/19 1241 134/75     Pulse Rate 03/19/19 1241 84     Resp --      Temp 03/19/19 1241 98.2 F (36.8 C)     Temp Source 03/19/19 1241 Oral     SpO2 03/19/19 1241 100 %     Weight 03/19/19 1235 148 lb (67.1 kg)     Height 03/19/19 1235 6\' 3"  (1.905 m)     Head Circumference --      Peak Flow --      Pain Score 03/19/19 1234 8     Pain Loc --      Pain Edu? --      Excl. in [place]? --     Constitutional: Alert and oriented. Well appearing and in no distress. Head: Normocephalic and atraumatic. Eyes: Conjunctivae are normal. Normal  extraocular movements Cardiovascular: Normal rate, regular rhythm.  Murmurs, rubs, or gallops.  Normal distal pulses. Respiratory: Normal respiratory effort. No wheezes/rales/rhonchi. Gastrointestinal: Soft and nontender. No distention. Musculoskeletal: reproducible anterior chest wall pain on palpation.  Nontender with normal range of motion in all extremities.  Neurologic:  Normal gait without ataxia. Normal speech and language. No gross focal neurologic deficits are appreciated. Skin:  Skin is warm, dry and intact. No rash noted. Psychiatric: Mood is anxious  and affect is flat. Patient exhibits appropriate insight and judgment. ____________________________________________   LABS (pertinent positives/negatives)  Labs Reviewed  BASIC METABOLIC PANEL - Abnormal; Notable for the following components:      Result Value   Glucose, Bld 104 (*)    All other components within normal limits  CBC - Abnormal; Notable for the following components:   WBC 3.5 (*)    All other components within normal limits  TROPONIN I  ____________________________________________  EKG  NSR 81 bpm PR Interval 165 ms QRS duration 102 ms Early repolarization No STEMI ____________________________________________   RADIOLOGY  CXR  Negative ____________________________________________  PROCEDURES  Procedures ____________________________________________  INITIAL IMPRESSION / ASSESSMENT AND PLAN / ED COURSE  Larry Soto was evaluated in Emergency Department on 03/19/2019 for the symptoms described in the history of present illness. He was evaluated in the context of the global COVID-19 pandemic, which necessitated consideration that the patient might be at risk for infection with the SARS-CoV-2 virus that causes COVID-19. Institutional protocols and algorithms that pertain to the evaluation of patients at risk for COVID-19 are in a state of rapid change based on information released by regulatory bodies including the CDC and federal and state organizations. These policies and algorithms were followed during the patient's care in the ED.  Differential diagnosis includes, but is not limited to, ACS, aortic dissection, pulmonary embolism, cardiac tamponade, pneumothorax, pneumonia, pericarditis, myocarditis, GI-related causes including esophagitis/gastritis, and musculoskeletal chest wall pain.    Patient with ED evaluation of chronic persistent chest pain, presents today with chest pain and referral into the left upper extremity.  Patient's exam is overall benign and  reassuring at this time.  His labs are within normal limits and his chest x-ray is negative for any acute cardiopulmonary process.  Troponin is negative at this time.  Patient with a heart score of 0, with no indication of ACS, CAD, or PE.  Will be discharged with instructions to follow-up with 1 of the local community clinics to establish primary care.  He will take previously prescribed medications for pain and inflammation as directed.  Return precautions have been reviewed. ____________________________________________  FINAL CLINICAL IMPRESSION(S) / ED DIAGNOSES  Final diagnoses:  Atypical chest pain      Karmen Stabs, Charlesetta Ivory, PA-C 03/19/19 1505    Arnaldo Natal, MD 03/19/19 [phone]

## 2019-03-19 NOTE — ED Notes (Signed)
This RN has attempted PIV 1x w/o success.

## 2019-03-19 NOTE — ED Notes (Signed)
Patient transported to X-ray 

## 2019-09-10 ENCOUNTER — Emergency Department
Admission: EM | Admit: 2019-09-10 | Discharge: 2019-09-10 | Disposition: A | Discharge status: Home / Self Care | Payer: Self-pay | Attending: Emergency Medicine | Admitting: Emergency Medicine

## 2019-09-10 ENCOUNTER — Other Ambulatory Visit: Payer: Self-pay

## 2019-09-10 ENCOUNTER — Encounter: Payer: Self-pay | Admitting: Emergency Medicine

## 2019-09-10 DIAGNOSIS — F17210 Nicotine dependence, cigarettes, uncomplicated: Secondary | ICD-10-CM | POA: Insufficient documentation

## 2019-09-10 DIAGNOSIS — G8929 Other chronic pain: Secondary | ICD-10-CM | POA: Insufficient documentation

## 2019-09-10 DIAGNOSIS — M549 Dorsalgia, unspecified: Secondary | ICD-10-CM | POA: Insufficient documentation

## 2019-09-10 DIAGNOSIS — Z79899 Other long term (current) drug therapy: Secondary | ICD-10-CM | POA: Insufficient documentation

## 2019-09-10 HISTORY — DX: Dorsalgia, unspecified: M54.9

## 2019-09-10 MED ORDER — MELOXICAM 15 MG PO TABS: 15.0000 mg | ORAL_TABLET | Freq: Every day | ORAL | 2 refills | Status: DC

## 2019-09-10 MED ORDER — METHOCARBAMOL 500 MG PO TABS: 500.0000 mg | ORAL_TABLET | Freq: Four times a day (QID) | ORAL | 0 refills | Status: DC

## 2019-09-10 NOTE — ED Notes (Signed)
See triage note  Presents with lower back pai   States pain has been going on for a while  But is now going down his right leg  Ambulates well  No injury

## 2019-09-10 NOTE — ED Triage Notes (Signed)
Pt reports hx of back pain but has never had it do down his leg. Pt states it has been radiating down his right leg for over a month.

## 2019-09-10 NOTE — Discharge Instructions (Addendum)
Follow-up with one of the suggested doctors offices for a full physical.  You need yearly blood work.  Take your medication as prescribed.
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
## 2020-01-14 ENCOUNTER — Other Ambulatory Visit: Payer: Self-pay

## 2020-01-14 ENCOUNTER — Emergency Department
Admission: EM | Admit: 2020-01-14 | Discharge: 2020-01-14 | Disposition: A | Discharge status: Home / Self Care | Payer: Self-pay | Attending: Emergency Medicine | Admitting: Emergency Medicine

## 2020-01-14 ENCOUNTER — Emergency Department: Payer: Self-pay

## 2020-01-14 DIAGNOSIS — F17200 Nicotine dependence, unspecified, uncomplicated: Secondary | ICD-10-CM | POA: Insufficient documentation

## 2020-01-14 DIAGNOSIS — J069 Acute upper respiratory infection, unspecified: Secondary | ICD-10-CM | POA: Insufficient documentation

## 2020-01-14 DIAGNOSIS — Z20822 Contact with and (suspected) exposure to covid-19: Secondary | ICD-10-CM | POA: Insufficient documentation

## 2020-01-14 LAB — SARS CORONAVIRUS 2 (TAT 6-24 HRS): SARS Coronavirus 2: NEGATIVE
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
## 2020-01-14 NOTE — ED Notes (Signed)
See triage note  Presents with some nasal congestion and dry cough  Denies any fever  Also having some discomfort in chest with inspiration

## 2020-01-14 NOTE — Discharge Instructions (Addendum)
Follow up with critical clinic if any continued problems.  Also you will be able to see the results of your Covid test on my chart.  You are to quarantine until you get the results of your test if it is negative.  If it is positive you will need an additional 10 days of quarantine.

## 2020-01-14 NOTE — ED Triage Notes (Signed)
Pt comes via POV from home with c/o congestion and cough. Pt states this stated over a week ago.

## 2020-03-22 ENCOUNTER — Other Ambulatory Visit: Payer: Self-pay

## 2020-03-22 ENCOUNTER — Emergency Department: Payer: Self-pay

## 2020-03-22 ENCOUNTER — Emergency Department
Admission: EM | Admit: 2020-03-22 | Discharge: 2020-03-22 | Discharge status: Against Medical Advice | Payer: Self-pay | Attending: Student | Admitting: Student

## 2020-03-22 DIAGNOSIS — Z720 Tobacco use: Secondary | ICD-10-CM | POA: Insufficient documentation

## 2020-03-22 DIAGNOSIS — R319 Hematuria, unspecified: Secondary | ICD-10-CM | POA: Insufficient documentation

## 2020-03-22 DIAGNOSIS — R102 Pelvic and perineal pain: Secondary | ICD-10-CM | POA: Insufficient documentation

## 2020-03-22 LAB — COMPREHENSIVE METABOLIC PANEL
ALT: 19 U/L (ref 0–44)
AST: 22 U/L (ref 15–41)
Albumin: 4.2 g/dL (ref 3.5–5.0)
Alkaline Phosphatase: 96 U/L (ref 38–126)
Anion gap: 8 (ref 5–15)
BUN: 14 mg/dL (ref 6–20)
CO2: 27 mmol/L (ref 22–32)
Calcium: 8.9 mg/dL (ref 8.9–10.3)
Chloride: 104 mmol/L (ref 98–111)
Creatinine, Ser: 1.26 mg/dL — ABNORMAL HIGH (ref 0.61–1.24)
GFR calc Af Amer: >60 mL/min (ref >60)
GFR calc non Af Amer: >60 mL/min (ref >60)
Glucose, Bld: 86 mg/dL (ref 70–99)
Potassium: 4.1 mmol/L (ref 3.5–5.1)
Sodium: 139 mmol/L (ref 135–145)
Total Bilirubin: 0.9 mg/dL (ref 0.3–1.2)
Total Protein: 7.5 g/dL (ref 6.5–8.1)

## 2020-03-22 LAB — URINALYSIS, COMPLETE (UACMP) WITH MICROSCOPIC
Bacteria, UA: NONE SEEN
Bilirubin Urine: NEGATIVE
Glucose, UA: NEGATIVE mg/dL
Ketones, ur: NEGATIVE mg/dL
Nitrite: NEGATIVE
Protein, ur: 100 mg/dL — AB
RBC / HPF: >50 RBC/hpf — ABNORMAL HIGH (ref 0–5)
Specific Gravity, Urine: 1.027 (ref 1.005–1.030)
WBC, UA: NONE SEEN WBC/hpf (ref 0–5)
pH: 9 — ABNORMAL HIGH (ref 5.0–8.0)

## 2020-03-22 LAB — CBC
HCT: 41.5 % (ref 39.0–52.0)
Hemoglobin: 13.7 g/dL (ref 13.0–17.0)
MCH: 27.7 pg (ref 26.0–34.0)
MCHC: 33 g/dL (ref 30.0–36.0)
MCV: 83.8 fL (ref 80.0–100.0)
Platelets: 169 10*3/uL (ref 150–400)
RBC: 4.95 MIL/uL (ref 4.22–5.81)
RDW: 14.1 % (ref 11.5–15.5)
WBC: 3.8 10*3/uL — ABNORMAL LOW (ref 4.0–10.5)
nRBC: 0 % (ref 0.0–0.2)

## 2020-03-22 LAB — CK: Total CK: 280 U/L (ref 49–397)

## 2020-03-22 LAB — LIPASE, BLOOD: Lipase: 21 U/L (ref 11–51)

## 2020-03-22 MED ORDER — SODIUM CHLORIDE 0.9% FLUSH: 3.0000 mL | Freq: Once | INTRAVENOUS | Status: DC

## 2020-03-22 MED ORDER — CEPHALEXIN 500 MG PO CAPS: 500.0000 mg | ORAL_CAPSULE | Freq: Four times a day (QID) | ORAL | 0 refills | Status: AC

## 2020-03-22 NOTE — ED Triage Notes (Signed)
Pt c/o mid to lower abd pain for the past 3 days with hematuria. Denies injury. Denies N/V/D

## 2020-03-22 NOTE — Discharge Instructions (Addendum)
Thank you for letting us take care of you in the emergency department today.   Please continue to take any regular, prescribed medications.   New medications we have prescribed:  Keflex, for urine infection  Please follow up with: Urology doctor   Please return to the ER for any new or worsening symptoms.

## 2020-03-22 NOTE — ED Provider Notes (Signed)
[facility] Emergency Department Provider Note  ____________________________________________   First MD Initiated Contact with Patient 03/22/20 1704     (approximate)  I have reviewed the triage vital signs and the nursing notes.  History  Chief Complaint Abdominal Pain and Hematuria    HPI Larry Soto is a 24 y.o. male who presents to the emergency department for hematuria and lower abdominal pain.  Symptoms first started 3 days ago and have been constant since onset.  He denies any preceding injury or inciting event.  Pain is primarily in the lower central abdomen, suprapubic.  He describes it as an aching type pain.  9/10 in severity.  No radiation.  No alleviating or aggravating components.  He describes noticeable hematuria, but no dysuria.  No penile discharge.  No concern for STD exposure.  No fever, nausea, vomiting.  No history of kidney stones.  No history of similar symptoms.  No retention or difficulty with urination itself.   Past Medical Hx Past Medical History:  Diagnosis Date  . Back pain     Problem List There are no problems to display for this patient.   Past Surgical Hx History reviewed. No pertinent surgical history.  Medications Prior to Admission medications   Medication Sig Start Date End Date Taking? Authorizing Provider  albuterol (PROVENTIL HFA;VENTOLIN HFA) 108 (90 Base) MCG/ACT inhaler Inhale 2 puffs into the lungs every 6 (six) hours as needed for wheezing or shortness of breath. 01/15/19   Menshew, Dannielle Karvonen, PA-C    Allergies Patient has no known allergies.  Family Hx No family history on file.  Social Hx Social History   Tobacco Use  . Smoking status: Current Some Day Smoker  . Smokeless tobacco: Never Used  Substance Use Topics  . Alcohol use: Yes  . Drug use: No     Review of Systems  Constitutional: Negative for fever. Negative for chills. Eyes: Negative for visual changes. ENT: Negative  for sore throat. Cardiovascular: Negative for chest pain. Respiratory: Negative for shortness of breath. Gastrointestinal: Negative for nausea. Negative for vomiting.  Positive for lower abdominal pain. Genitourinary: Negative for dysuria.  Positive for hematuria. Musculoskeletal: Negative for leg swelling. Skin: Negative for rash. Neurological: Negative for headaches.   Physical Exam  Vital Signs: ED Triage Vitals  Enc Vitals Group     BP 03/22/20 1352 (!) 146/68     Pulse Rate 03/22/20 1352 68     Resp 03/22/20 1352 16     Temp 03/22/20 1352 98.8 F (37.1 C)     Temp Source 03/22/20 1352 Oral     SpO2 03/22/20 1352 100 %     Weight 03/22/20 1353 153 lb (69.4 kg)     Height 03/22/20 1353 6\' 3"  (1.905 m)     Head Circumference --      Peak Flow --      Pain Score 03/22/20 1353 9     Pain Loc --      Pain Edu? --      Excl. in [place]? --     Constitutional: Alert and oriented. Well appearing. NAD.  Head: Normocephalic. Atraumatic. Eyes: Conjunctivae clear. Sclera anicteric. Pupils equal and symmetric. Nose: No masses or lesions. No congestion or rhinorrhea. Mouth/Throat: Wearing mask.  Neck: No stridor. Trachea midline.  Cardiovascular: Normal rate, regular rhythm. Extremities well perfused. Respiratory: Normal respiratory effort.  Lungs CTAB. Gastrointestinal: Soft. Non-distended.  Mild discomfort with palpation in the suprapubic area, no rebound, guarding, rigidity.  Remainder of abdomen is soft and nontender. Genitourinary: RN chaperone present.  No testicular swelling, erythema, tenderness.  Testes of normal lie.  No penile discharge.  No rashes or lesions. Musculoskeletal: No lower extremity edema. No deformities. Neurologic:  Normal speech and language. No gross focal or lateralizing neurologic deficits are appreciated.  Skin: Skin is warm, dry and intact. No rash noted. Psychiatric: Mood and affect are appropriate for situation.    Radiology  Personally reviewed  available imaging myself.   CT - IMPRESSION:  No acute findings in the abdomen or pelvis. Specifically, no  findings to explain the patient's history of lower abdominal pain  and hematuria.    Procedures  Procedure(s) performed (including critical care):  Procedures   Initial Impression / Assessment and Plan / MDM / ED Course  24 y.o. male who presents to the ED for lower abdominal discomfort and hematuria, as above  Ddx: cystitis/UTI, kidney stone, bladder irritation, rhabdo  Will plan for labs, urine studies, imaging  CT scan negative for acute findings, no stone.  Creatinine 1.26, very slightly elevated from 1.14 previously.  CK within normal limits.  UA cloudy, large hemoglobin, RBCs, trace LE.  No nitrates, but in the setting of his suprapubic discomfort + hematuria, would opt to treat as UTI/cystitis with course of antibiotics.  Patient is agreeable.  Also recommended follow-up with urology in clinic.  Patient voices understanding and is comfortable with the plan.   _______________________________   As part of my medical decision making I have reviewed available labs, radiology tests, reviewed old records/performed chart review.   Final Clinical Impression(s) / ED Diagnosis  Final diagnoses:  Hematuria, unspecified type       Note:  This document was prepared using Dragon voice recognition software and may include unintentional dictation errors.   Miguel Aschoff., MD 03/22/20 [phone]

## 2020-03-22 NOTE — ED Notes (Signed)
Attempted to d/c pt, pt found to not be in room. Will reassess.

## 2020-03-22 NOTE — ED Notes (Signed)
Patient transported to CT 

## 2020-03-22 NOTE — ED Notes (Signed)
attempted to call pt with d/c instructions. HIPAA compliant voice message left for pt.

## 2020-07-15 ENCOUNTER — Ambulatory Visit: Payer: Self-pay | Admitting: Physician Assistant

## 2020-07-15 ENCOUNTER — Other Ambulatory Visit: Payer: Self-pay

## 2020-07-15 DIAGNOSIS — Z113 Encounter for screening for infections with a predominantly sexual mode of transmission: Secondary | ICD-10-CM

## 2020-07-15 DIAGNOSIS — A5401 Gonococcal cystitis and urethritis, unspecified: Secondary | ICD-10-CM

## 2020-07-15 LAB — GRAM STAIN

## 2020-07-15 MED ORDER — CEFTRIAXONE SODIUM 500 MG IJ SOLR
500.0000 mg | Freq: Once | INTRAMUSCULAR | Status: AC
  Administered 2020-07-15: 500 mg via INTRAMUSCULAR

## 2020-07-15 MED ORDER — DOXYCYCLINE HYCLATE 100 MG PO TABS: 100.0000 mg | ORAL_TABLET | Freq: Two times a day (BID) | ORAL | 0 refills | Status: AC

## 2020-07-15 NOTE — Progress Notes (Signed)
Patient here for STD testing. Denies symptoms. Condoms given. Harvie Heck, RN   Post: RN reviewed gram stain results with patient. Positive for gonorrhea today. Rn treated patient with ceftriaxone IM and Doxycycline #14 per provider orders. Patient tolerated well. All orders complete.

## 2020-07-16 ENCOUNTER — Encounter: Payer: Self-pay | Admitting: Physician Assistant

## 2020-07-16 NOTE — Progress Notes (Signed)
[facility] Department STI clinic/screening visit  Subjective:  Larry Soto is a 24 y.o. male being seen today for an STI screening visit. The patient reports they do have symptoms.    Patient has the following medical conditions:   Patient Active Problem List   Diagnosis Date Noted  . Hyperglycemia 09/10/2013     Chief Complaint  Patient presents with  . SEXUALLY TRANSMITTED DISEASE    screening    HPI  Patient reports that he has had dysuria for about 1 week.  Denies other symptoms, chronic conditions and surgeries.  Reports last HIV test was around a year ago.  States last void prior to sample collection for Gram stain was about 1 hr ago.   See flowsheet for further details and programmatic requirements.    The following portions of the patient's history were reviewed and updated as appropriate: allergies, current medications, past medical history, past social history, past surgical history and problem list.  Objective:  There were no vitals filed for this visit.  Physical Exam Constitutional:      General: He is not in acute distress.    Appearance: Normal appearance.  HENT:     Head: Normocephalic and atraumatic.     Comments: No nits, lice, or hair loss. No cervical, supraclavicular or axillary adenopathy.    Mouth/Throat:     Mouth: Mucous membranes are moist.     Pharynx: Oropharynx is clear. No oropharyngeal exudate or posterior oropharyngeal erythema.  Eyes:     Conjunctiva/sclera: Conjunctivae normal.  Pulmonary:     Effort: Pulmonary effort is normal.  Abdominal:     Palpations: Abdomen is soft. There is no mass.     Tenderness: There is no abdominal tenderness. There is no guarding or rebound.  Genitourinary:    Penis: Normal.      Testes: Normal.     Comments: Pubic area without nits, lice, edema, erythema or lesions. Shotty inguinal adenopathy bilaterally with R > L. Penis circumcised, without rash, lesions and discharge at  meatus. Musculoskeletal:     Cervical back: Neck supple. No tenderness.  Skin:    General: Skin is warm and dry.     Findings: No bruising, erythema, lesion or rash.  Neurological:     Mental Status: He is alert and oriented to person, place, and time.  Psychiatric:        Mood and Affect: Mood normal.        Behavior: Behavior normal.        Thought Content: Thought content normal.        Judgment: Judgment normal.       Assessment and Plan:  Larry Soto is a 24 y.o. male presenting to the [facility] Department for STI screening  1. Screening for STD (sexually transmitted disease) Patient into clinic with symptoms. Patient stated that he was having tooth pain and rec try OTC analgesics regularly with food until can see dental professional for evaluation. Rec condoms with all sex. Await test results.  Counseled that RN will call if needs to RTC for treatment once results are back. - Gram stain - Gonococcus culture - HIV [place] [facility] - Syphilis Serology, [facility]  2. Gonococcal urethritis in male Treat for GC and cover for Chlamydia with Ceftriaxone 500 mg IM and Doxycycline 100 mg #14 1 po BID for 7 days. No sex for 7 days and until after partner/s complete treatment. Call with questions or concerns. - doxycycline (VIBRA-TABS)  100 MG tablet; Take 1 tablet (100 mg total) by mouth 2 (two) times daily for 7 days.  Dispense: 14 tablet; Refill: 0 - cefTRIAXone (ROCEPHIN) injection 500 mg     No follow-ups on file.  No future appointments.  Matt Holmes, PA

## 2020-08-11 ENCOUNTER — Other Ambulatory Visit: Payer: Self-pay

## 2020-08-11 ENCOUNTER — Ambulatory Visit: Payer: Self-pay | Admitting: Physician Assistant

## 2020-08-11 DIAGNOSIS — Z113 Encounter for screening for infections with a predominantly sexual mode of transmission: Secondary | ICD-10-CM

## 2020-08-11 DIAGNOSIS — N341 Nonspecific urethritis: Secondary | ICD-10-CM

## 2020-08-11 LAB — GRAM STAIN

## 2020-08-11 MED ORDER — DOXYCYCLINE HYCLATE 100 MG PO TABS: 100.0000 mg | ORAL_TABLET | Freq: Two times a day (BID) | ORAL | 0 refills | Status: AC

## 2020-08-11 NOTE — Progress Notes (Signed)
Gram stain reviewed, pt treated with Doxycycline per provider orders. Provider orders completed.

## 2020-08-12 ENCOUNTER — Encounter: Payer: Self-pay | Admitting: Physician Assistant

## 2020-08-12 NOTE — Progress Notes (Signed)
[facility] Department STI clinic/screening visit  Subjective:  Larry Soto is a 24 y.o. male being seen today for an STI screening visit. The patient reports they do not have symptoms.    Patient has the following medical conditions:   Patient Active Problem List   Diagnosis Date Noted  . Hyperglycemia 09/10/2013     Chief Complaint  Patient presents with  . SEXUALLY TRANSMITTED DISEASE    screening    HPI  Patient reports that he is not having any symptoms but would like a screening today.  Denies chronic conditions, surgeries and regular medicines.  States last HIV test was 1 month ago.  Reports last void prior to sample collection was about 2 hr ago.    See flowsheet for further details and programmatic requirements.    The following portions of the patient's history were reviewed and updated as appropriate: allergies, current medications, past medical history, past social history, past surgical history and problem list.  Objective:  There were no vitals filed for this visit.  Physical Exam Constitutional:      General: He is not in acute distress.    Appearance: Normal appearance.  HENT:     Head: Normocephalic and atraumatic.     Comments: No nits,lice, or hair loss. No cervical, supraclavicular or axillary adenopathy.    Mouth/Throat:     Mouth: Mucous membranes are moist.     Pharynx: Oropharynx is clear. No oropharyngeal exudate or posterior oropharyngeal erythema.  Eyes:     Conjunctiva/sclera: Conjunctivae normal.  Pulmonary:     Effort: Pulmonary effort is normal.  Abdominal:     Palpations: Abdomen is soft. There is no mass.     Tenderness: There is no abdominal tenderness. There is no guarding or rebound.  Genitourinary:    Penis: Normal.      Testes: Normal.     Comments: Pubic area without nits, lice, hair loss, edema, erythema, lesions and inguinal adenopathy. Penis circumcised without rash and lesions. Small amount of clear  discharge at meatus. Musculoskeletal:     Cervical back: Neck supple. No tenderness.  Skin:    General: Skin is warm and dry.     Findings: No bruising, erythema, lesion or rash.  Neurological:     Mental Status: He is alert and oriented to person, place, and time.  Psychiatric:        Mood and Affect: Mood normal.        Behavior: Behavior normal.        Thought Content: Thought content normal.        Judgment: Judgment normal.       Assessment and Plan:  Micheal Murad is a 24 y.o. male presenting to the [facility] Department for STI screening  1. Screening for STD (sexually transmitted disease) Patient into clinic without symptoms. Rec condoms with all sex. Await test results.  Counseled that RN will call if needs to RTC for treatment once results are back. - Gram stain - Gonococcus culture - HIV [place] [facility] - Syphilis Serology, [facility]  2. NGU (nongonococcal urethritis) Will treat for NGU based on exam findings with Doxycycline 100 mg #14 1 po BID for 7 days. No sex for 7 days and until after partner/s complete treatment. Call with questions or concerns. - doxycycline (VIBRA-TABS) 100 MG tablet; Take 1 tablet (100 mg total) by mouth 2 (two) times daily for 7 days.  Dispense: 14 tablet; Refill: 0  No follow-ups on file.  No future appointments.  Jerene Dilling, PA

## 2020-08-16 LAB — GONOCOCCUS CULTURE

## 2020-11-03 ENCOUNTER — Emergency Department
Admission: EM | Admit: 2020-11-03 | Discharge: 2020-11-03 | Disposition: A | Discharge status: Home / Self Care | Payer: No Typology Code available for payment source | Attending: Emergency Medicine | Admitting: Emergency Medicine

## 2020-11-03 ENCOUNTER — Emergency Department: Payer: No Typology Code available for payment source

## 2020-11-03 ENCOUNTER — Other Ambulatory Visit: Payer: Self-pay

## 2020-11-03 ENCOUNTER — Encounter: Payer: Self-pay | Admitting: Emergency Medicine

## 2020-11-03 DIAGNOSIS — M542 Cervicalgia: Secondary | ICD-10-CM | POA: Diagnosis not present

## 2020-11-03 DIAGNOSIS — M545 Low back pain, unspecified: Secondary | ICD-10-CM | POA: Insufficient documentation

## 2020-11-03 DIAGNOSIS — F17200 Nicotine dependence, unspecified, uncomplicated: Secondary | ICD-10-CM | POA: Insufficient documentation

## 2020-11-03 DIAGNOSIS — Y92410 Unspecified street and highway as the place of occurrence of the external cause: Secondary | ICD-10-CM | POA: Diagnosis not present

## 2020-11-03 MED ORDER — BACLOFEN 5 MG PO TABS
5.0000 mg | ORAL_TABLET | Freq: Three times a day (TID) | ORAL | 0 refills | Status: DC | PRN
Start: 2020-11-03 — End: 2024-03-09

## 2020-11-03 MED ORDER — IBUPROFEN 600 MG PO TABS
600.0000 mg | ORAL_TABLET | Freq: Four times a day (QID) | ORAL | 0 refills | Status: DC | PRN
Start: 2020-11-03 — End: 2021-06-15
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
## 2020-11-03 NOTE — ED Notes (Signed)
PT going to CT
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
## 2020-11-29 ENCOUNTER — Other Ambulatory Visit: Payer: Self-pay

## 2020-11-29 ENCOUNTER — Ambulatory Visit: Payer: Medicaid Other | Admitting: Physician Assistant

## 2020-11-29 DIAGNOSIS — Z113 Encounter for screening for infections with a predominantly sexual mode of transmission: Secondary | ICD-10-CM | POA: Diagnosis not present

## 2020-11-29 DIAGNOSIS — Z299 Encounter for prophylactic measures, unspecified: Secondary | ICD-10-CM | POA: Diagnosis not present

## 2020-11-29 LAB — GRAM STAIN

## 2020-11-29 MED ORDER — DOXYCYCLINE HYCLATE 100 MG PO TABS: 100.0000 mg | ORAL_TABLET | Freq: Two times a day (BID) | ORAL | 0 refills | Status: AC

## 2020-11-30 ENCOUNTER — Encounter: Payer: Self-pay | Admitting: Physician Assistant

## 2020-11-30 NOTE — Progress Notes (Signed)
[facility] STI clinic/screening visit  Subjective:  Larry Soto is a 25 y.o. male being seen today for an STI screening visit. The patient reports they do have symptoms.    Patient has the following medical conditions:   Patient Active Problem List   Diagnosis Date Noted  . Hyperglycemia 09/10/2013     Chief Complaint  Patient presents with  . SEXUALLY TRANSMITTED DISEASE    screening    HPI  Patient reports that he has had dysuria for 1 week.  Denies other symptoms, chronic conditions and surgeries.  States last HIV test was in 08/2020 and last void prior to sample collection for Gram stain was over 2 hr ago.   See flowsheet for further details and programmatic requirements.    The following portions of the patient's history were reviewed and updated as appropriate: allergies, current medications, past medical history, past social history, past surgical history and problem list.  Objective:  There were no vitals filed for this visit.  Physical Exam Constitutional:      General: He is not in acute distress.    Appearance: Normal appearance.  HENT:     Head: Normocephalic and atraumatic.     Comments: No nits,lice, or hair loss. No cervical, supraclavicular or axillary adenopathy.    Mouth/Throat:     Mouth: Mucous membranes are moist.     Pharynx: Oropharynx is clear. No oropharyngeal exudate or posterior oropharyngeal erythema.  Eyes:     Conjunctiva/sclera: Conjunctivae normal.  Pulmonary:     Effort: Pulmonary effort is normal.  Abdominal:     Palpations: Abdomen is soft. There is no mass.     Tenderness: There is no abdominal tenderness. There is no guarding or rebound.  Genitourinary:    Penis: Normal.      Testes: Normal.     Comments: Pubic area without nits, lice, hair loss, edema, erythema, lesions and inguinal adenopathy. Penis circumcised without rash, lesions and discharge at meatus. Musculoskeletal:     Cervical back:  Neck supple. No tenderness.  Skin:    General: Skin is warm and dry.     Findings: No bruising, erythema, lesion or rash.  Neurological:     Mental Status: He is alert and oriented to person, place, and time.  Psychiatric:        Mood and Affect: Mood normal.        Behavior: Behavior normal.        Thought Content: Thought content normal.        Judgment: Judgment normal.       Assessment and Plan:  Larry Soto is a 25 y.o. male presenting to the [facility] Department for STI screening  1. Screening for STD (sexually transmitted disease) Patient into clinic with symptoms. Reviewed with patient Gram stain results. Rec condoms with all sex. Await test results.  Counseled that RN will call if needs to RTC for treatment once results are back. - Gram stain - Gonococcus culture - HIV [place] [facility] - Syphilis Serology, [facility]  2. Prophylactic measure Due to patient having symptoms and risks will cover for NGU with Doxycycline 100 mg #14 1 po BID for 7 days. No sex for 14 days. Enc patient to inform partners to get screened. Call with questions or concerns. - doxycycline (VIBRA-TABS) 100 MG tablet; Take 1 tablet (100 mg total) by mouth 2 (two) times daily for 7 days.  Dispense: 14 tablet; Refill: 0  No follow-ups on file.  No future appointments.  Jerene Dilling, PA

## 2020-12-04 LAB — GONOCOCCUS CULTURE

## 2020-12-10 NOTE — Progress Notes (Signed)
Chart reviewed by Pharmacist  Suzanne Walker PharmD, Contract Pharmacist at [facility]  

## 2021-06-14 ENCOUNTER — Emergency Department
Admission: EM | Admit: 2021-06-14 | Discharge: 2021-06-14 | Disposition: A | Discharge status: Home / Self Care | Payer: Medicaid Other | Attending: Emergency Medicine | Admitting: Emergency Medicine

## 2021-06-14 ENCOUNTER — Other Ambulatory Visit: Payer: Self-pay

## 2021-06-14 DIAGNOSIS — Z5321 Procedure and treatment not carried out due to patient leaving prior to being seen by health care provider: Secondary | ICD-10-CM | POA: Insufficient documentation

## 2021-06-14 DIAGNOSIS — K047 Periapical abscess without sinus: Secondary | ICD-10-CM | POA: Insufficient documentation

## 2021-06-14 NOTE — ED Triage Notes (Signed)
Pt to ED for abscess to upper left tooth for a few days. Swelling noted to left side of face. RR even and unlabored .

## 2021-06-15 ENCOUNTER — Emergency Department
Admission: EM | Admit: 2021-06-15 | Discharge: 2021-06-15 | Disposition: A | Discharge status: Home / Self Care | Payer: Medicaid Other | Attending: Emergency Medicine | Admitting: Emergency Medicine

## 2021-06-15 ENCOUNTER — Other Ambulatory Visit: Payer: Self-pay

## 2021-06-15 DIAGNOSIS — F17200 Nicotine dependence, unspecified, uncomplicated: Secondary | ICD-10-CM | POA: Insufficient documentation

## 2021-06-15 DIAGNOSIS — K047 Periapical abscess without sinus: Secondary | ICD-10-CM | POA: Insufficient documentation

## 2021-06-15 MED ORDER — IBUPROFEN 600 MG PO TABS
600.0000 mg | ORAL_TABLET | Freq: Four times a day (QID) | ORAL | 0 refills | Status: DC | PRN
Start: 2021-06-15 — End: 2024-03-09

## 2021-06-15 MED ORDER — IBUPROFEN 800 MG PO TABS
800.0000 mg | ORAL_TABLET | Freq: Once | ORAL | Status: AC
  Administered 2021-06-15: 800 mg via ORAL
  Filled 2021-06-15: qty 1

## 2021-06-15 MED ORDER — TRAMADOL HCL 50 MG PO TABS
50.0000 mg | ORAL_TABLET | Freq: Once | ORAL | Status: AC
  Administered 2021-06-15: 50 mg via ORAL
  Filled 2021-06-15: qty 1

## 2021-06-15 MED ORDER — AMOXICILLIN-POT CLAVULANATE 875-125 MG PO TABS: 1.0000 | ORAL_TABLET | Freq: Two times a day (BID) | ORAL | 0 refills | Status: AC

## 2021-06-15 MED ORDER — AMOXICILLIN-POT CLAVULANATE 875-125 MG PO TABS
1.0000 | ORAL_TABLET | Freq: Once | ORAL | Status: AC
  Administered 2021-06-15: 1 via ORAL
  Filled 2021-06-15: qty 1

## 2021-06-15 NOTE — ED Provider Notes (Signed)
[facility] EMERGENCY DEPARTMENT Provider Note   CSN: [id number] Arrival date & time: 06/15/21  1550     History Chief Complaint  Patient presents with  . Dental Pain    Larry Soto is a 25 y.o. male presents to the emergency department for evaluation of left upper tooth dental abscess.  Symptoms have been present for a few days.  No fevers, difficulty swallowing.  Denies any drainage.  He denies any trauma or injury.  No numbness or tingling or headaches.  HPI     Past Medical History:  Diagnosis Date  . Back pain     Patient Active Problem List   Diagnosis Date Noted  . Hyperglycemia 09/10/2013    History reviewed. No pertinent surgical history.     No family history on file.  Social History   Tobacco Use  . Smoking status: Some Days  . Smokeless tobacco: Never  Substance Use Topics  . Alcohol use: Yes    Comment: sometimes  . Drug use: Not Currently    Types: Marijuana    Home Medications Prior to Admission medications   Medication Sig Start Date End Date Taking? Authorizing Provider  amoxicillin-clavulanate (AUGMENTIN) 875-125 MG tablet Take 1 tablet by mouth every 12 (twelve) hours for 7 days. 06/15/21 06/22/21 Yes Evon Slack, PA-C  ibuprofen (ADVIL) 600 MG tablet Take 1 tablet (600 mg total) by mouth every 6 (six) hours as needed for moderate pain. 06/15/21  Yes Evon Slack, PA-C  albuterol (PROVENTIL HFA;VENTOLIN HFA) 108 (90 Base) MCG/ACT inhaler Inhale 2 puffs into the lungs every 6 (six) hours as needed for wheezing or shortness of breath. 01/15/19   Menshew, Charlesetta Ivory, PA-C  Baclofen 5 MG TABS Take 5 mg by mouth 3 (three) times daily as needed. 11/03/20   Enid Derry, PA-C    Allergies    Patient has no known allergies.  Review of Systems   Review of Systems  Constitutional: Negative.  Negative for chills and fever.  HENT:  Positive for dental problem and facial swelling. Negative for drooling, mouth  sores, trouble swallowing and voice change.   Respiratory:  Negative for shortness of breath.   Cardiovascular:  Negative for chest pain.  Gastrointestinal:  Negative for nausea and vomiting.  Musculoskeletal:  Negative for arthralgias, neck pain and neck stiffness.  Skin: Negative.   Psychiatric/Behavioral:  Negative for confusion.   All other systems reviewed and are negative.  Physical Exam Updated Vital Signs BP 122/78 (BP Location: Left Arm)   Pulse 95   Temp 99.5 F (37.5 C) (Oral)   Resp 16   SpO2 98%   Physical Exam Constitutional:      General: He is not in acute distress.    Appearance: He is well-developed.  HENT:     Head: Normocephalic and atraumatic.     Jaw: No trismus.     Right Ear: External ear normal.     Left Ear: External ear normal.     Nose: Nose normal.     Mouth/Throat:     Mouth: No oral lesions.     Dentition: Dental caries and dental abscesses present.     Pharynx: Uvula midline. No uvula swelling.  Cardiovascular:     Rate and Rhythm: Normal rate.     Heart sounds: No murmur heard.   No friction rub. No gallop.  Pulmonary:     Effort: Pulmonary effort is normal. No respiratory distress.     Breath  sounds: Normal breath sounds.  Musculoskeletal:     Cervical back: Normal range of motion and neck supple.  Skin:    General: Skin is warm and dry.  Neurological:     Mental Status: He is alert and oriented to person, place, and time.  Psychiatric:        Behavior: Behavior normal.        Thought Content: Thought content normal.    ED Results / Procedures / Treatments   Labs (all labs ordered are listed, but only abnormal results are displayed) Labs Reviewed - No data to display  EKG None  Radiology No results found.  Procedures Procedures   Medications Ordered in ED Medications  ibuprofen (ADVIL) tablet 800 mg (has no administration in time range)  traMADol (ULTRAM) tablet 50 mg (has no administration in time range)   amoxicillin-clavulanate (AUGMENTIN) 875-125 MG per tablet 1 tablet (has no administration in time range)    ED Course  I have reviewed the triage vital signs and the nursing notes.  Pertinent labs & imaging results that were available during my care of the patient were reviewed by me and considered in my medical decision making (see chart for details).    MDM Rules/Calculators/A&P                          Final Clinical Impression(s) / ED Diagnoses Final diagnoses:  Dental abscess  25 year old male with left upper dental infection/abscess.  Slight facial swelling with no fluctuance.  Vital signs stable.  He is started on Augmentin and given a prescription for ibuprofen.  He understands signs symptoms return to the ER for.  He will call dental clinic to schedule follow-up.  Rx / DC Orders ED Discharge Orders          Ordered    amoxicillin-clavulanate (AUGMENTIN) 875-125 MG tablet  Every 12 hours        06/15/21 1714    ibuprofen (ADVIL) 600 MG tablet  Every 6 hours PRN        06/15/21 1714             Evon Slack, PA-C 06/15/21 1716    Shaune Pollack, MD 06/20/21 [phone]

## 2021-06-15 NOTE — Discharge Instructions (Addendum)
Take antibiotics as prescribed.  Make sure you are drinking fluids.  Return for any fevers, worsening symptoms or any changes in your health or difficulty swallowing.  Take ibuprofen and Tylenol as needed for pain.

## 2021-06-15 NOTE — ED Triage Notes (Signed)
Pt comes with c/o dental pain. Pt was seen for upper abscess few days ago.
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
## 2021-07-01 ENCOUNTER — Encounter: Payer: Self-pay | Admitting: Emergency Medicine

## 2021-07-01 ENCOUNTER — Emergency Department: Payer: Medicaid Other

## 2021-07-01 ENCOUNTER — Emergency Department
Admission: EM | Admit: 2021-07-01 | Discharge: 2021-07-01 | Disposition: A | Discharge status: Home / Self Care | Payer: Medicaid Other | Attending: Emergency Medicine | Admitting: Emergency Medicine

## 2021-07-01 ENCOUNTER — Other Ambulatory Visit: Payer: Self-pay

## 2021-07-01 DIAGNOSIS — F17200 Nicotine dependence, unspecified, uncomplicated: Secondary | ICD-10-CM | POA: Insufficient documentation

## 2021-07-01 DIAGNOSIS — S62339A Displaced fracture of neck of unspecified metacarpal bone, initial encounter for closed fracture: Secondary | ICD-10-CM

## 2021-07-01 DIAGNOSIS — M79641 Pain in right hand: Secondary | ICD-10-CM | POA: Insufficient documentation

## 2021-07-01 DIAGNOSIS — S0993XA Unspecified injury of face, initial encounter: Secondary | ICD-10-CM | POA: Insufficient documentation

## 2021-07-01 DIAGNOSIS — S62316A Displaced fracture of base of fifth metacarpal bone, right hand, initial encounter for closed fracture: Secondary | ICD-10-CM | POA: Insufficient documentation

## 2021-07-01 MED ORDER — CEPHALEXIN 500 MG PO CAPS: 1000.0000 mg | ORAL_CAPSULE | Freq: Two times a day (BID) | ORAL | 0 refills | Status: DC

## 2021-07-01 MED ORDER — HYDROCODONE-ACETAMINOPHEN 5-325 MG PO TABS
1.0000 | ORAL_TABLET | Freq: Once | ORAL | Status: AC
  Administered 2021-07-01: 1 via ORAL
  Filled 2021-07-01: qty 1

## 2021-07-01 MED ORDER — HYDROCODONE-ACETAMINOPHEN 5-325 MG PO TABS: 1.0000 | ORAL_TABLET | ORAL | 0 refills | Status: AC | PRN

## 2021-07-01 MED ORDER — METHOCARBAMOL 500 MG PO TABS: 500.0000 mg | ORAL_TABLET | Freq: Four times a day (QID) | ORAL | 0 refills | Status: DC
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
## 2022-01-02 ENCOUNTER — Encounter: Payer: Self-pay | Admitting: Family Medicine

## 2022-01-02 ENCOUNTER — Ambulatory Visit: Payer: Self-pay | Admitting: Family Medicine

## 2022-01-02 ENCOUNTER — Other Ambulatory Visit: Payer: Self-pay

## 2022-01-02 DIAGNOSIS — Z113 Encounter for screening for infections with a predominantly sexual mode of transmission: Secondary | ICD-10-CM

## 2022-01-02 LAB — HM HIV SCREENING LAB: HM HIV Screening: NEGATIVE

## 2022-01-02 LAB — GRAM STAIN

## 2022-01-02 NOTE — Progress Notes (Signed)
[facility] STI clinic/screening visit  Subjective:  Larry Soto is a 26 y.o. male being seen today for an STI screening visit. The patient reports they do not have symptoms.    Patient has the following medical conditions:   Patient Active Problem List   Diagnosis Date Noted   Hyperglycemia 09/10/2013     Chief Complaint  Patient presents with   SEXUALLY TRANSMITTED DISEASE    Screening     HPI  Patient reports here for screening, denies s/sx   Does the patient or their partner desires a pregnancy in the next year? No  Screening for MPX risk: Does the patient have an unexplained rash? No Is the patient MSM? No Does the patient endorse multiple sex partners or anonymous sex partners? Yes Did the patient have close or sexual contact with a person diagnosed with MPX? No Has the patient traveled outside the [place] where MPX is endemic? No Is there a high clinical suspicion for MPX-- evidenced by one of the following No  -Unlikely to be chickenpox  -Lymphadenopathy  -Rash that present in same phase of evolution on any given body part   See flowsheet for further details and programmatic requirements.    The following portions of the patient's history were reviewed and updated as appropriate: allergies, current medications, past medical history, past social history, past surgical history and problem list.  Objective:  There were no vitals filed for this visit.  Physical Exam Constitutional:      Appearance: Normal appearance.  HENT:     Head: Normocephalic.     Mouth/Throat:     Mouth: Mucous membranes are moist.     Pharynx: Oropharynx is clear. No oropharyngeal exudate.  Pulmonary:     Effort: Pulmonary effort is normal.  Genitourinary:    Penis: Normal.      Testes: Normal.     Comments: No lice, nits, or pest, no lesions or odor discharge.  Denies pain or tenderness with paplation of testicles.  No lesions, ulcers or masses present.     Musculoskeletal:     Cervical back: Normal range of motion.  Lymphadenopathy:     Cervical: No cervical adenopathy.  Skin:    General: Skin is warm and dry.     Findings: No bruising, erythema, lesion or rash.  Neurological:     Mental Status: He is alert.  Psychiatric:        Mood and Affect: Mood normal.        Behavior: Behavior normal.      Assessment and Plan:  Larry Soto is a 26 y.o. male presenting to the [facility] Department for STI screening  1. Screening examination for venereal disease Patient does not have STI symptoms Patient accepted all screenings including  gram stain,  urethral GC and bloodwork for HIV/RPR.  Patient meets criteria for HepB screening? Yes. Ordered? No - declined  Patient meets criteria for HepC screening? Yes. Ordered? No - declined  Recommended condom use with all sex Discussed importance of condom use for STI prevent  Treat gram stain per standing order Discussed time line for [facility] results and that patient will be called with positive results and encouraged patient to call if he had not heard in 2 weeks Recommended returning for continued or worsening symptoms.   - HIV [place] [facility] - Syphilis Serology,  Lab - Gonococcus culture - Gram stain    No follow-ups on file.  No future appointments.  Junious Dresser, FNP

## 2022-01-07 LAB — GONOCOCCUS CULTURE

## 2022-02-12 IMAGING — CR DG HAND COMPLETE 3+V*R*
3 series · 3 of 3 positions shown · non-contrast
Comparison: None.

CLINICAL DATA: Hand injury.

EXAM:
RIGHT HAND - COMPLETE 3+ VIEW

[hand ap]
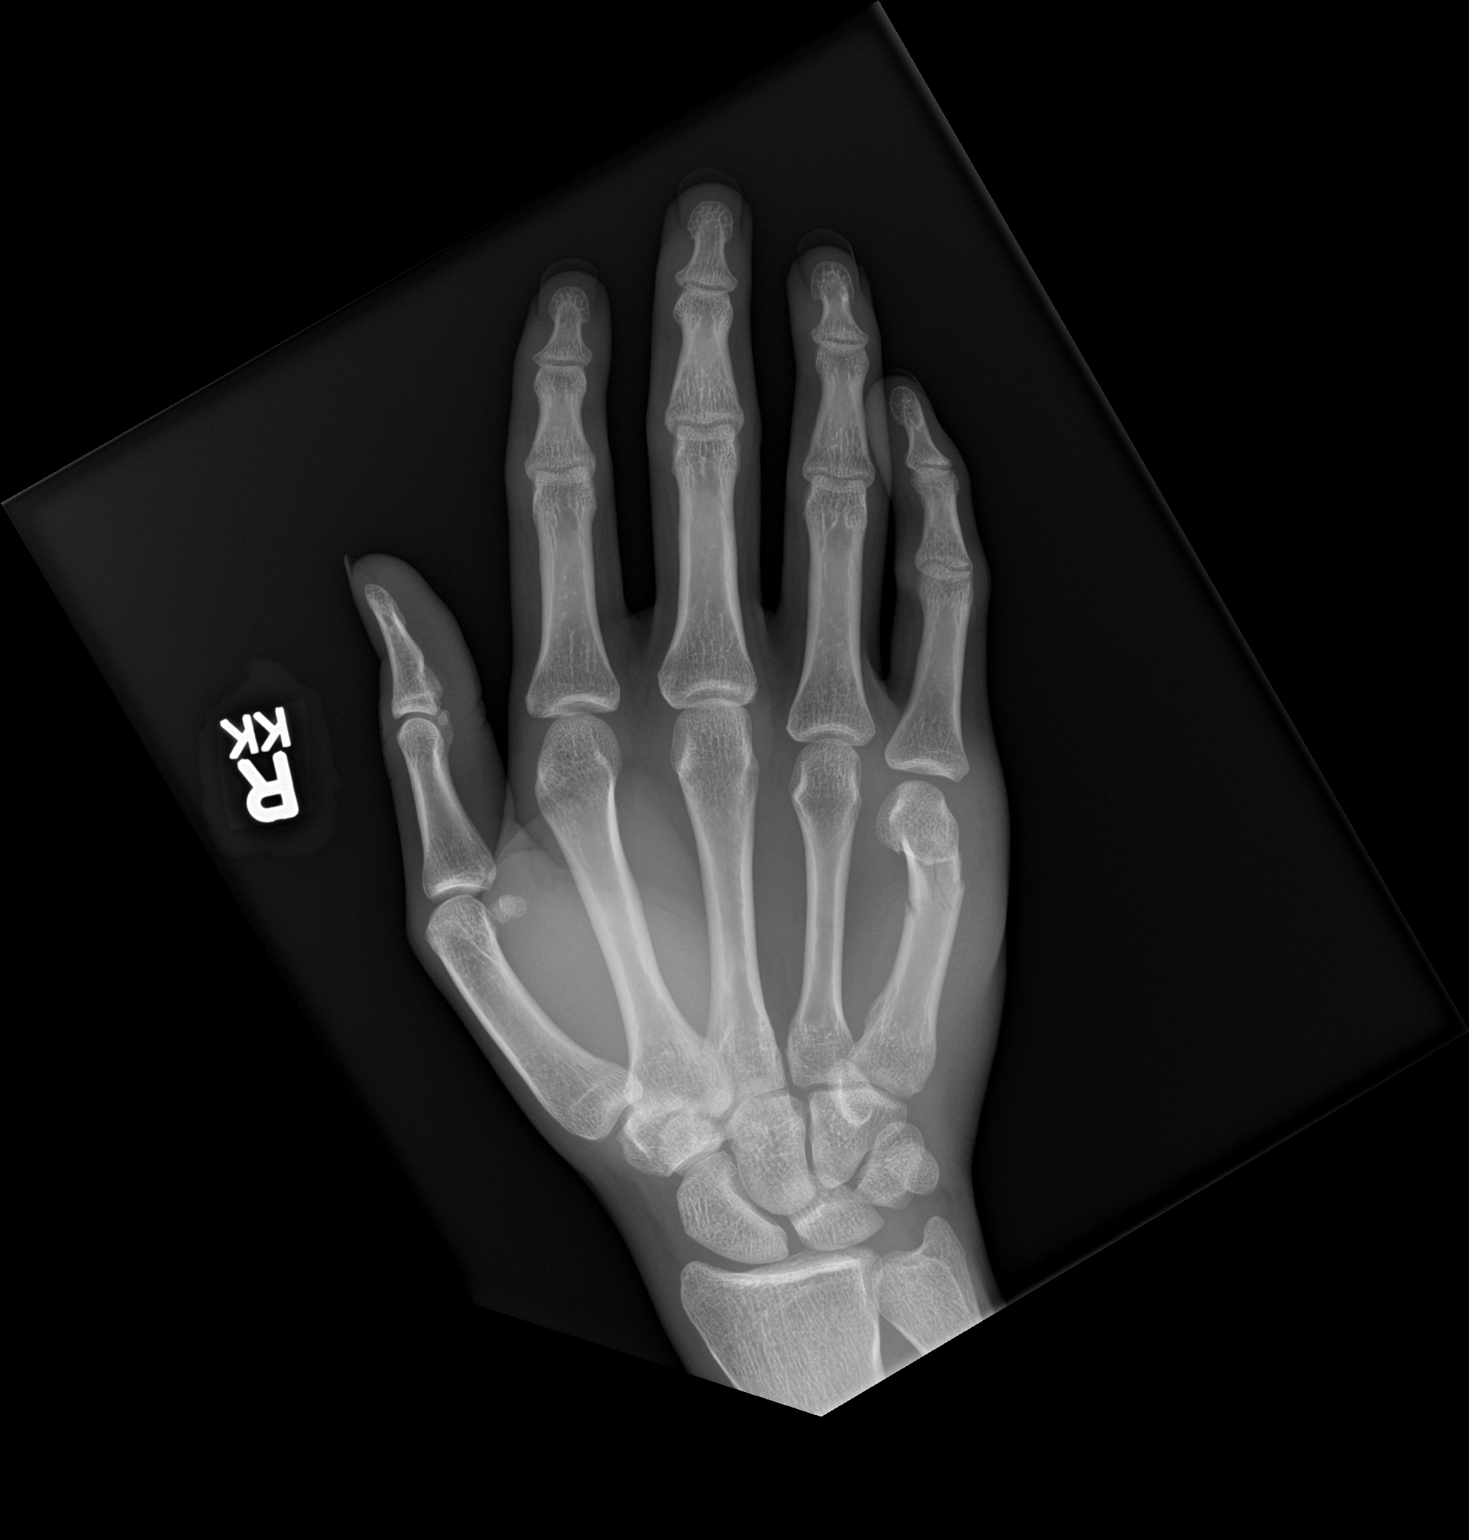

[hand obl]
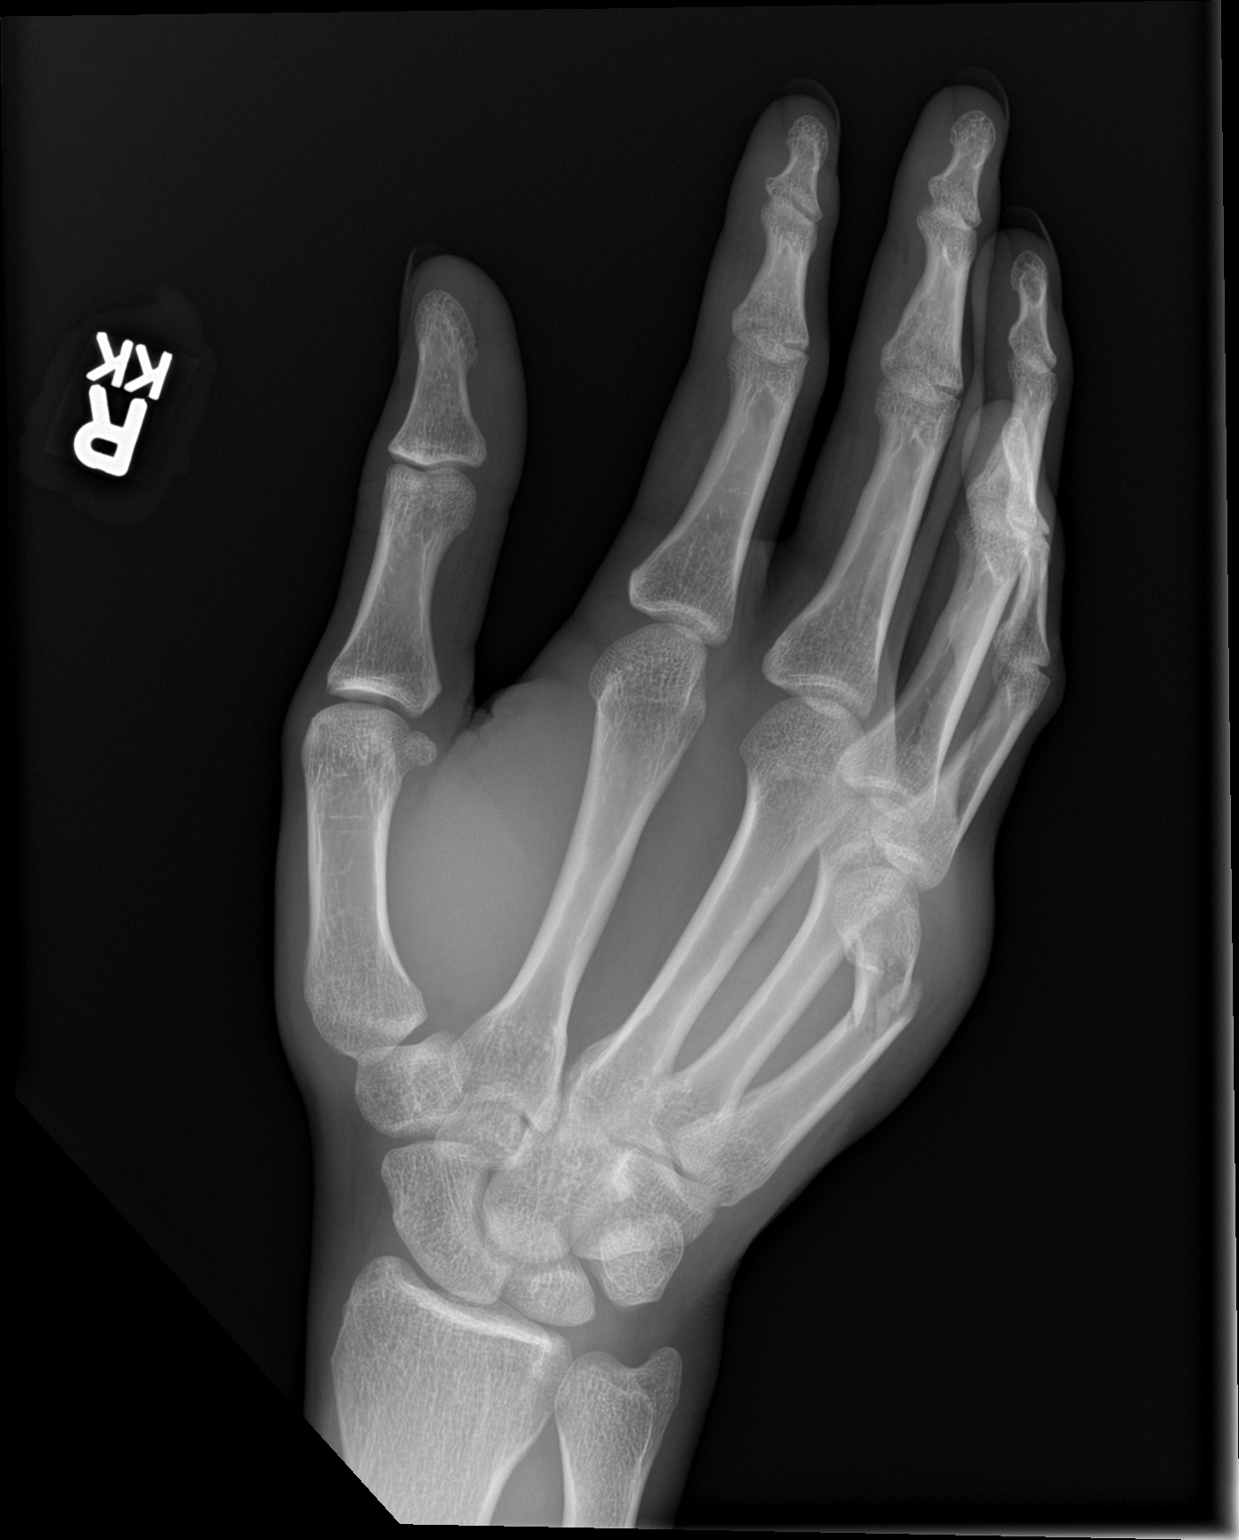

[hand lat]
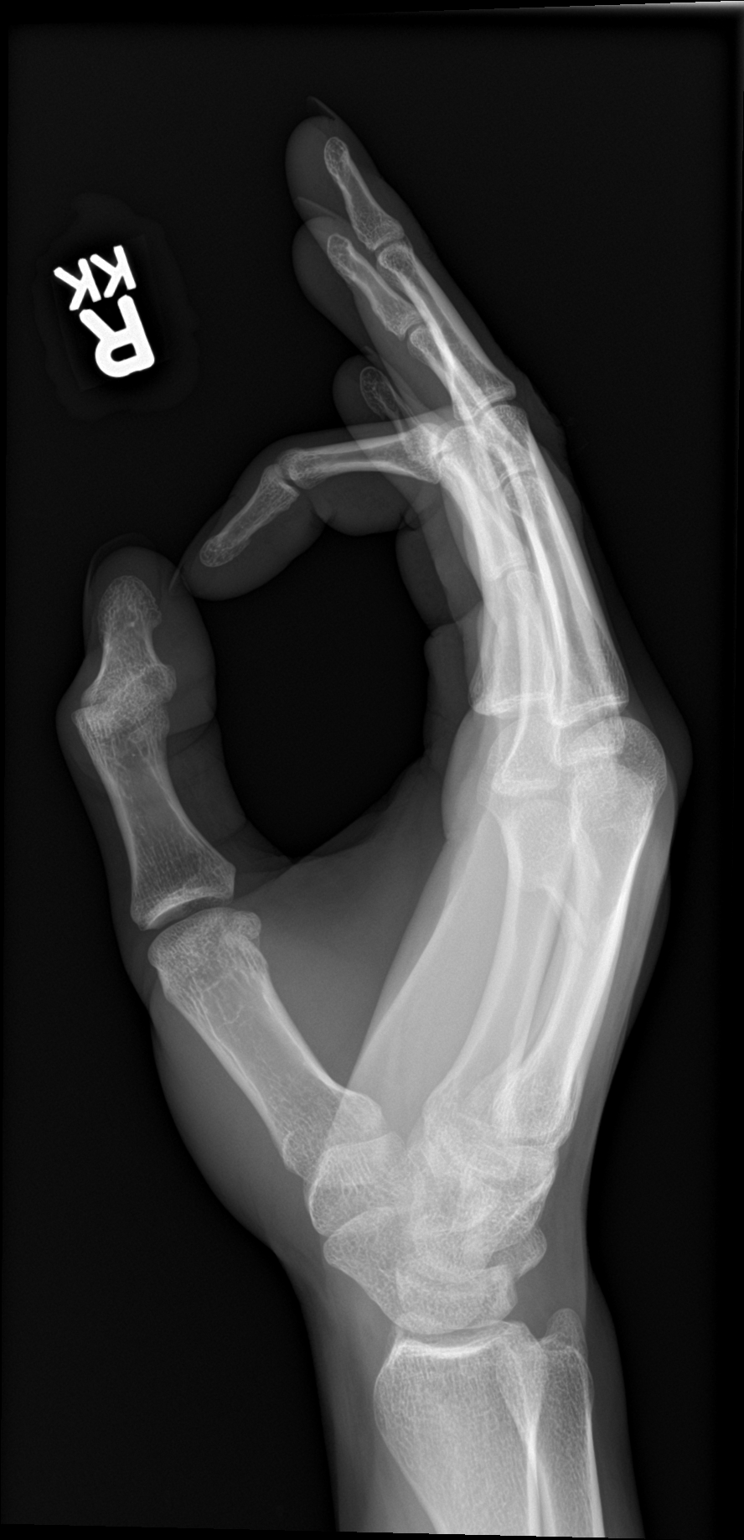

[3 of 3 positions shown; findings below may reference images not displayed]

FINDINGS: Acute comminuted fracture of the distal fifth metacarpal shaft and
neck with 45 degree volar angulation. Adjacent soft tissue swelling.
No additional fracture. No dislocation. Joint spaces are preserved.
Bone mineralization is normal.
IMPRESSION: 1. Acute comminuted angulated fracture of the distal fifth
metacarpal.

## 2022-02-16 ENCOUNTER — Ambulatory Visit: Payer: Medicaid Other

## 2022-02-21 ENCOUNTER — Ambulatory Visit: Payer: Medicaid Other

## 2022-03-15 ENCOUNTER — Encounter: Payer: Self-pay | Admitting: Nurse Practitioner

## 2022-03-15 ENCOUNTER — Ambulatory Visit: Payer: Self-pay | Admitting: Nurse Practitioner

## 2022-03-15 DIAGNOSIS — Z113 Encounter for screening for infections with a predominantly sexual mode of transmission: Secondary | ICD-10-CM

## 2022-03-15 LAB — HEPATITIS B SURFACE ANTIGEN: Hepatitis B Surface Ag: NONREACTIVE

## 2022-03-15 LAB — HM HEPATITIS C SCREENING LAB: HM Hepatitis Screen: NEGATIVE

## 2022-03-15 LAB — HM HIV SCREENING LAB: HM HIV Screening: NEGATIVE

## 2022-03-15 NOTE — Progress Notes (Signed)
[facility] Department ?STI clinic/screening visit ? ?Subjective:  ?Larry Soto is a 26 y.o. male being seen today for an STI screening visit. The patient reports they do have symptoms.   ? ?Patient has the following medical conditions:   ?Patient Active Problem List  ? Diagnosis Date Noted  ? Hyperglycemia 09/10/2013  ? ? ? ?Chief Complaint  ?Patient presents with  ? SEXUALLY TRANSMITTED DISEASE  ?  STI screening. Endorses burning sensation in penis  ? ? ?HPI ? ?Patient reports to clinic today for STD screening.  Patient reports genital itching and inconsistent lower abdominal pain for one month, and a lesion that developed one week ago to his lip.   ? ?Does the patient or their partner desires a pregnancy in the next year? No ? ?Screening for MPX risk: ?Does the patient have an unexplained rash? No ?Is the patient MSM? No ?Does the patient endorse multiple sex partners or anonymous sex partners? No ?Did the patient have close or sexual contact with a person diagnosed with MPX? No ?Has the patient traveled outside the [place] where MPX is endemic? No ?Is there a high clinical suspicion for MPX-- evidenced by one of the following No ? -Unlikely to be chickenpox ? -Lymphadenopathy ? -Rash that present in same phase of evolution on any given body part ? ? ?See flowsheet for further details and programmatic requirements.  ? ?Immunization History  ?Administered Date(s) Administered  ? Tdap 05/11/2016  ?  ? ?The following portions of the patient's history were reviewed and updated as appropriate: allergies, current medications, past medical history, past social history, past surgical history and problem list. ? ?Objective:  ?There were no vitals filed for this visit. ? ?Physical Exam ?Constitutional:   ?   Appearance: Normal appearance.  ?HENT:  ?   Head: Normocephalic.  ?   Right Ear: External ear normal.  ?   Left Ear: External ear normal.  ?   Nose: Nose normal.  ?   Mouth/Throat:  ?   Lips: Pink.  ?   Mouth:  Mucous membranes are moist.  ?   Comments: Poor dentition, ulcerated lesion noted to right side of lip.  ?Pulmonary:  ?   Effort: Pulmonary effort is normal.  ?Abdominal:  ?   General: Abdomen is flat.  ?   Palpations: Abdomen is soft.  ?Genitourinary: ?   Comments: Pubic area without nits, lice, hair loss, edema, erythema, lesions and inguinal adenopathy. ?Penis without rash, lesions and discharge at meatus. ?Testicles descended bilaterally,nt, no masses or edema.  ?Musculoskeletal:  ?   Cervical back: Full passive range of motion without pain, normal range of motion and neck supple.  ?Lymphadenopathy:  ?   Lower Body: Right inguinal adenopathy present. Left inguinal adenopathy present.  ?Skin: ?   General: Skin is warm and dry.  ?Neurological:  ?   Mental Status: He is alert and oriented to person, place, and time.  ?Psychiatric:     ?   Attention and Perception: Attention normal.     ?   Behavior: Behavior is cooperative.  ? ? ? ? ?Assessment and Plan:  ?Larry Soto is a 26 y.o. male presenting to the [facility] Department for STI screening ? ?1. Screening examination for venereal disease ?-26 year old male in clinic today for STD screening. ?-Patient does have STI symptoms ?Patient accepted all screenings including  urine CT/GC and bloodwork for HIV/RPR. Virology sent for HSV culture of lip.  ?Patient meets criteria  for HepB screening? Yes. Ordered? Yes ?Patient meets criteria for HepC screening? Yes. Ordered? Yes ?Recommended condom use with all sex ?Discussed importance of condom use for STI prevent ? ? ?Discussed time line for [facility] results and that patient will be called with positive results and encouraged patient to call if he had not heard in 2 weeks ?Recommended returning for continued or worsening symptoms.   ? ?- Chlamydia/Gonorrhea [place] [facility] ?- HIV/HCV [place] [facility] ?- Syphilis Serology, [place] [facility] ?- HBV Antigen/Antibody [facility] ?- Virology, [place] [facility] ? ? ? ? ?Return if  symptoms worsen or fail to improve. ? ? ? ?Glenna Fellows, FNP ?

## 2022-10-22 ENCOUNTER — Other Ambulatory Visit: Payer: Self-pay

## 2022-10-22 ENCOUNTER — Emergency Department: Payer: Medicaid Other

## 2022-10-22 ENCOUNTER — Encounter: Payer: Self-pay | Admitting: Emergency Medicine

## 2022-10-22 ENCOUNTER — Emergency Department
Admission: EM | Admit: 2022-10-22 | Discharge: 2022-10-22 | Disposition: A | Discharge status: Home / Self Care | Payer: Medicaid Other | Attending: Student in an Organized Health Care Education/Training Program | Admitting: Student in an Organized Health Care Education/Training Program

## 2022-10-22 DIAGNOSIS — S92534A Nondisplaced fracture of distal phalanx of right lesser toe(s), initial encounter for closed fracture: Secondary | ICD-10-CM | POA: Diagnosis not present

## 2022-10-22 DIAGNOSIS — S62339A Displaced fracture of neck of unspecified metacarpal bone, initial encounter for closed fracture: Secondary | ICD-10-CM

## 2022-10-22 DIAGNOSIS — W228XXA Striking against or struck by other objects, initial encounter: Secondary | ICD-10-CM | POA: Insufficient documentation

## 2022-10-22 DIAGNOSIS — S6991XA Unspecified injury of right wrist, hand and finger(s), initial encounter: Secondary | ICD-10-CM | POA: Diagnosis present

## 2022-10-22 DIAGNOSIS — M79644 Pain in right finger(s): Secondary | ICD-10-CM | POA: Diagnosis not present

## 2022-10-22 MED ORDER — OXYCODONE-ACETAMINOPHEN 5-325 MG PO TABS: 1.0000 | ORAL_TABLET | ORAL | 0 refills | Status: AC | PRN

## 2022-10-22 MED ORDER — OXYCODONE-ACETAMINOPHEN 5-325 MG PO TABS
1.0000 | ORAL_TABLET | Freq: Once | ORAL | Status: AC
  Administered 2022-10-22: 1 via ORAL
  Filled 2022-10-22: qty 1

## 2022-10-22 NOTE — ED Provider Notes (Signed)
[facility] Provider Note    Event Date/Time   First MD Initiated Contact with Patient 10/22/22 1034     (approximate)   History   Hand Injury   HPI  Larry Soto is a 26 y.o. male who presents to the ER for evaluation of right hand pain.  Patient punched a metal object last night.  Denies any other associated injury he is right-hand dominant.  Is wearing a ring to the right little finger.  Denies any numbness or tingling.     Physical Exam   Triage Vital Signs: ED Triage Vitals [10/22/22 1011]  Enc Vitals Group     BP 125/87     Pulse Rate 72     Resp 20     Temp 98.8 F (37.1 C)     Temp Source Oral     SpO2 99 %     Weight 154 lb (69.9 kg)     Height 6\' 2"  (1.88 m)     Head Circumference      Peak Flow      Pain Score 9     Pain Loc      Pain Edu?      Excl. in GC?     Most recent vital signs: Vitals:   10/22/22 1011  BP: 125/87  Pulse: 72  Resp: 20  Temp: 98.8 F (37.1 C)  SpO2: 99%     Constitutional: Alert  Eyes: Conjunctivae are normal.  Head: Atraumatic. Nose: No congestion/rhinnorhea. Mouth/Throat: Mucous membranes are moist.   Neck: Painless ROM.  Cardiovascular:   Good peripheral circulation. Respiratory: Normal respiratory effort.  No retractions.  Gastrointestinal: Soft and nontender.  Musculoskeletal:  no deformity, soft tissue swelling tenderness palpation over the mid fifth metacarpal.  No laceration no open wound.  Neurovascular intact distally Neurologic:  MAE spontaneously. No gross focal neurologic deficits are appreciated.  Skin:  Skin is warm, dry and intact. No rash noted. Psychiatric: Mood and affect are normal. Speech and behavior are normal.    ED Results / Procedures / Treatments   Labs (all labs ordered are listed, but only abnormal results are displayed) Labs Reviewed - No data to display   EKG     RADIOLOGY Please see ED Course for my review and interpretation.  I personally  reviewed all radiographic images ordered to evaluate for the above acute complaints and reviewed radiology reports and findings.  These findings were personally discussed with the patient.  Please see medical record for radiology report.    PROCEDURES:  Critical Care performed: No  Procedures   MEDICATIONS ORDERED IN ED: Medications  oxyCODONE-acetaminophen (PERCOCET/ROXICET) 5-325 MG per tablet 1 tablet (1 tablet Oral Given 10/22/22 1105)     IMPRESSION / MDM / ASSESSMENT AND PLAN / ED COURSE  I reviewed the triage vital signs and the nursing notes.                              Differential diagnosis includes, but is not limited to, fracture, contusion, dislocation  Patient presented to the ER for evaluation of right hand pain with x-ray imaging on my review and interpretation showing evidence of boxer's fracture.  No rotational deformity or significant displacement.  Ring will be removed.  Patient placed in splint.  Will be given referral to orthopedics.       FINAL CLINICAL IMPRESSION(S) / ED DIAGNOSES   Final diagnoses:  Closed boxer's  fracture, initial encounter     Rx / DC Orders   ED Discharge Orders          Ordered    oxyCODONE-acetaminophen (PERCOCET) 5-325 MG tablet  Every 4 hours PRN        10/22/22 1127             Note:  This document was prepared using Dragon voice recognition software and may include unintentional dictation errors.    Willy Eddy, MD 10/22/22 [phone]

## 2022-10-22 NOTE — ED Triage Notes (Signed)
Pt punched metal object last night and now has swelling and pain to right hand.

## 2022-10-31 ENCOUNTER — Other Ambulatory Visit: Payer: Self-pay | Admitting: Orthopedic Surgery

## 2022-11-06 ENCOUNTER — Ambulatory Visit: Admission: RE | Admit: 2022-11-06 | Payer: Medicaid Other | Source: Ambulatory Visit | Admitting: Orthopedic Surgery

## 2022-11-06 ENCOUNTER — Encounter: Payer: Self-pay | Admitting: Anesthesiology

## 2022-11-06 ENCOUNTER — Encounter: Admission: RE | Payer: Self-pay | Source: Ambulatory Visit

## 2022-11-06 SURGERY — OPEN REDUCTION INTERNAL FIXATION (ORIF) METACARPAL
Anesthesia: Choice | Site: Finger | Laterality: Right

## 2024-03-09 ENCOUNTER — Encounter: Payer: Self-pay | Admitting: Emergency Medicine

## 2024-03-09 ENCOUNTER — Other Ambulatory Visit: Payer: Self-pay

## 2024-03-09 ENCOUNTER — Emergency Department
Admission: EM | Admit: 2024-03-09 | Discharge: 2024-03-09 | Disposition: A | Discharge status: Home / Self Care | Attending: Emergency Medicine | Admitting: Emergency Medicine

## 2024-03-09 DIAGNOSIS — J029 Acute pharyngitis, unspecified: Secondary | ICD-10-CM | POA: Insufficient documentation

## 2024-03-09 DIAGNOSIS — F17200 Nicotine dependence, unspecified, uncomplicated: Secondary | ICD-10-CM | POA: Diagnosis not present

## 2024-03-09 LAB — GROUP A STREP BY PCR: Group A Strep by PCR: NOT DETECTED

## 2024-03-09 MED ORDER — LIDOCAINE VISCOUS HCL 2 % MT SOLN
15.0000 mL | Freq: Once | OROMUCOSAL | Status: AC
  Administered 2024-03-09: 15 mL via ORAL
  Filled 2024-03-09: qty 15

## 2024-03-09 MED ORDER — ALUM & MAG HYDROXIDE-SIMETH 200-200-20 MG/5ML PO SUSP
30.0000 mL | Freq: Once | ORAL | Status: AC
  Administered 2024-03-09: 30 mL via ORAL
  Filled 2024-03-09: qty 30

## 2024-03-09 NOTE — ED Triage Notes (Signed)
 Pt in via POV, reports waking up w/ sore throat this morning.  States it is painful to eat/drink due to pain.  Ambulatory to triage, NAD noted at this time.

## 2024-03-09 NOTE — ED Provider Notes (Signed)
 [facility] Provider Note    Event Date/Time   First MD Initiated Contact with Patient 03/09/24 1220     (approximate)   History   Sore Throat   HPI  Larry Soto is a 28 y.o. male   presents to the ED with complaint of sore throat that began for Larry Soto this morning when he woke up.  Reports that it is painful to eat and drink.  He denies any fever, chills, nausea or vomiting.  He reports that he was exposed to his girlfriend who may have had "cold symptoms".  No over-the-counter medications been taken.  Patient is a smoker.      Physical Exam   Triage Vital Signs: ED Triage Vitals  Encounter Vitals Group     BP 03/09/24 1159 119/77     Systolic BP Percentile --      Diastolic BP Percentile --      Pulse Rate 03/09/24 1159 72     Resp 03/09/24 1159 16     Temp 03/09/24 1159 97.9 F (36.6 C)     Temp Source 03/09/24 1159 Oral     SpO2 03/09/24 1159 98 %     Weight 03/09/24 1200 149 lb (67.6 kg)     Height 03/09/24 1200 6\' 2"  (1.88 m)     Head Circumference --      Peak Flow --      Pain Score 03/09/24 1200 8     Pain Loc --      Pain Education --      Exclude from Growth Chart --     Most recent vital signs: Vitals:   03/09/24 1159  BP: 119/77  Pulse: 72  Resp: 16  Temp: 97.9 F (36.6 C)  SpO2: 98%     General: Awake, no distress.  Normal speech and patient is able to speak in complete sentences without difficulty. CV:  Good peripheral perfusion.  Heart regular rate and rhythm. Resp:  Normal effort.  Lungs are clear bilaterally. Abd:  No distention.  Other:  Posterior pharynx without erythema or exudate.  Uvula is midline.  Moist oral mucosa.  Neck is supple without cervical lymphadenopathy.   ED Results / Procedures / Treatments   Labs (all labs ordered are listed, but only abnormal results are displayed) Labs Reviewed  GROUP A STREP BY PCR      PROCEDURES:  Critical Care performed:   Procedures   MEDICATIONS  ORDERED IN ED: Medications  alum & mag hydroxide-simeth (MAALOX/MYLANTA) 200-200-20 MG/5ML suspension 30 mL (30 mLs Oral Given 03/09/24 1342)    And  lidocaine  (XYLOCAINE ) 2 % viscous mouth solution 15 mL (15 mLs Oral Given 03/09/24 1342)     IMPRESSION / MDM / ASSESSMENT AND PLAN / ED COURSE  I reviewed the triage vital signs and the nursing notes.   Differential diagnosis includes, but is not limited to, strep pharyngitis, viral pharyngitis, uvulitis, eustachian tube dysfunction, otitis media.  28 year old male presents to the ED with complaint of sore throat that began this morning.  No over-the-counter medications have been taken.  Patient was made aware that his strep test was negative and physical exam essentially is benign.  He was given Maalox with viscous lidocaine  which gave him complete relief of his sore throat.  He is to increase fluids and follow-up with Dr. Silvestre Drum if continued sore throat.  Increase fluids, discontinue smoking, Tylenol  or ibuprofen  as needed for throat pain.  He was also made aware  that this could be related to seasonal allergies.        Patient's presentation is most consistent with acute complicated illness / injury requiring diagnostic workup.  FINAL CLINICAL IMPRESSION(S) / ED DIAGNOSES   Final diagnoses:  Pharyngitis, unspecified etiology     Rx / DC Orders   ED Discharge Orders     None        Note:  This document was prepared using Dragon voice recognition software and may include unintentional dictation errors.   Stafford Eagles, PA-C 03/09/24 1406    Viviano Ground, MD 03/10/24 [phone]

## 2024-03-09 NOTE — Discharge Instructions (Signed)
 Follow-up with Dr. Silvestre Drum if any continued problems or concerns.  Increase fluids to stay hydrated.  This could also be related to pollen versus a virus.  Today your strep test is negative.  You may take Tylenol  or ibuprofen  if needed for throat pain.  Avoid foods that are high in acid such as tomato sauce, tomato paste, orange juice and spices.
